# Patient Record
Sex: Male | Born: 1965 | Race: White | Hispanic: No | Marital: Married | Smoking: Never smoker
Health system: Southern US, Community
[De-identification: ages and names within clinical notes are randomized; demographics above are authoritative.]

## PROBLEM LIST (undated history)

## (undated) DIAGNOSIS — E039 Hypothyroidism, unspecified: Secondary | ICD-10-CM

## (undated) DIAGNOSIS — C61 Malignant neoplasm of prostate: Secondary | ICD-10-CM

## (undated) DIAGNOSIS — E785 Hyperlipidemia, unspecified: Secondary | ICD-10-CM

## (undated) DIAGNOSIS — I10 Essential (primary) hypertension: Secondary | ICD-10-CM

## (undated) DIAGNOSIS — E079 Disorder of thyroid, unspecified: Secondary | ICD-10-CM

## (undated) HISTORY — PX: COLONOSCOPY: SHX174

## (undated) HISTORY — PX: TONSILLECTOMY: SUR1361

## (undated) HISTORY — DX: Hyperlipidemia, unspecified: E78.5

---

## 2002-01-23 ENCOUNTER — Encounter: Admission: RE | Admit: 2002-01-23 | Discharge: 2002-01-23 | Payer: Self-pay | Admitting: Internal Medicine

## 2002-01-23 ENCOUNTER — Encounter: Payer: Self-pay | Admitting: Internal Medicine

## 2007-12-12 ENCOUNTER — Encounter: Admission: RE | Admit: 2007-12-12 | Discharge: 2007-12-12 | Payer: Self-pay | Admitting: Internal Medicine

## 2013-11-04 DIAGNOSIS — C61 Malignant neoplasm of prostate: Secondary | ICD-10-CM

## 2013-11-04 HISTORY — PX: PROSTATE BIOPSY: SHX241

## 2013-11-04 HISTORY — DX: Malignant neoplasm of prostate: C61

## 2013-12-16 ENCOUNTER — Ambulatory Visit (HOSPITAL_COMMUNITY)
Admission: RE | Admit: 2013-12-16 | Discharge: 2013-12-16 | Disposition: A | Discharge status: Home / Self Care | Payer: Managed Care, Other (non HMO) | Source: Ambulatory Visit | Attending: Urology | Admitting: Urology

## 2013-12-16 ENCOUNTER — Other Ambulatory Visit (HOSPITAL_COMMUNITY): Payer: Self-pay | Admitting: Urology

## 2013-12-16 DIAGNOSIS — I10 Essential (primary) hypertension: Secondary | ICD-10-CM | POA: Insufficient documentation

## 2013-12-16 DIAGNOSIS — C61 Malignant neoplasm of prostate: Secondary | ICD-10-CM | POA: Insufficient documentation

## 2013-12-23 ENCOUNTER — Encounter: Payer: Self-pay | Admitting: Radiation Oncology

## 2013-12-23 DIAGNOSIS — C61 Malignant neoplasm of prostate: Secondary | ICD-10-CM | POA: Insufficient documentation

## 2013-12-23 NOTE — Progress Notes (Signed)
GU Location of Tumor / Histology: prostate  If Prostate Cancer, Gleason Score is (3 + 3) and PSA is (5.19 on 07/21/13)  Patient presented 5 months ago with signs/symptoms of: elevated PSA, chronic nocturia x 1  Biopsies of prostate (if applicable) revealed: 05/07/75  adenocarcinoma, 2/12 cores, Gleason 3+3=6, volume 35 cc  Past/Anticipated interventions by urology, if any: biopsy  Past/Anticipated interventions by medical oncology, if any: none  Weight changes, if any: no  Bowel/Bladder complaints, if any:  Nocturia x 1,  IPSS 1  Nausea/Vomiting, if any: no  Pain issues, if any:  no  SAFETY ISSUES:  Prior radiation? no  Pacemaker/ICD? no  Possible current pregnancy? na  Is the patient on methotrexate? no  Current Complaints / other details:  Married, 1 son, 1 daughter, Engineer, building services.  Leaning towards surgery, but would like 2nd opinion. Referral placed to [facility] urology.  Father had prostate cancer age 54, treated successfully with radiation, lived to be 37.

## 2013-12-24 ENCOUNTER — Ambulatory Visit
Admission: RE | Admit: 2013-12-24 | Discharge: 2013-12-24 | Disposition: A | Discharge status: Home / Self Care | Payer: Managed Care, Other (non HMO) | Source: Ambulatory Visit | Attending: Radiation Oncology | Admitting: Radiation Oncology

## 2013-12-24 ENCOUNTER — Encounter: Payer: Self-pay | Admitting: Radiation Oncology

## 2013-12-24 VITALS — BP 154/93 | HR 82 | Temp 98.5°F | Resp 20 | Ht 74.0 in | Wt 255.6 lb

## 2013-12-24 DIAGNOSIS — C61 Malignant neoplasm of prostate: Secondary | ICD-10-CM | POA: Insufficient documentation

## 2013-12-24 DIAGNOSIS — Z923 Personal history of irradiation: Secondary | ICD-10-CM | POA: Insufficient documentation

## 2013-12-24 DIAGNOSIS — Z79899 Other long term (current) drug therapy: Secondary | ICD-10-CM | POA: Insufficient documentation

## 2013-12-24 HISTORY — DX: Malignant neoplasm of prostate: C61

## 2013-12-24 HISTORY — DX: Essential (primary) hypertension: I10

## 2013-12-24 HISTORY — DX: Disorder of thyroid, unspecified: E07.9

## 2013-12-24 NOTE — Progress Notes (Signed)
[facility] NEW PATIENT EVALUATION  Name: Adam Crane MRN: [id number]  Date:   12/24/2013           DOB: [birth year]  Status: outpatient   CC:   Molli Hazard,* Dr. Wenda Low   REFERRING PHYSICIAN: Molli Hazard,*   DIAGNOSIS: Stage TI C. favorable risk adenocarcinoma prostate.   HISTORY OF PRESENT ILLNESS:  Adam Crane is a 48 y.o. male who is seen today through the courtesy of Dr. Jasmine December for evaluation of his stage TI C. favorable risk adenocarcinoma prostate. He was found by his primary care physician to have a PSA of 5.49 on 07/06/2013. The patient is not aware of any previous PSA determinations. The patient was seen in consultation by Dr. Jasmine December who repeated his PSA on 07/21/2013 and this was 5.19. He underwent ultrasound-guided biopsies of Dr. Jasmine December on 11/04/2013. He was found to have Gleason 6 (3+3) involving 5% of one core from the right lateral mid gland, 10% of one core from right lateral apex, and 5 of the biopsy showing atypia, PIN or high-grade PIN. His gland volume was 35 cc. His pathology was reviewed by Dr. Tommie Ard at [facility]. He concurred. He is doing well from a GU and GI standpoint. His I PSS score is 1. He is potent.  PREVIOUS RADIATION THERAPY: No   PAST MEDICAL HISTORY:  has a past medical history of Prostate cancer (11/04/13); Thyroid disease; and Hypertension.     PAST SURGICAL HISTORY:  Past Surgical History  Procedure Laterality Date  . Tonsillectomy    . Prostate biopsy  11/04/13    gleason 6, vol 35 cc     FAMILY HISTORY: family history includes Cancer in his father; Cancer (age of onset: 33) in his paternal grandfather; Macular degeneration in his mother. As follows diagnosed with prostate cancer in his 19s and underwent radiation therapy.    SOCIAL HISTORY:  reports that he has never smoked. He does not have any smokeless tobacco history on file. He reports that he drinks  alcohol. He reports that he does not use illicit drugs. Married, 2 children. He manages an Administrator.   ALLERGIES: Review of patient's allergies indicates no known allergies.   MEDICATIONS:  Current Outpatient Prescriptions  Medication Sig Dispense Refill  . amLODipine (NORVASC) 5 MG tablet Take 5 mg by mouth daily.      Marland Kitchen aspirin 81 MG tablet Take 81 mg by mouth daily.      . Flaxseed, Linseed, (FLAX SEED OIL PO) Take by mouth.      . levothyroxine (SYNTHROID, LEVOTHROID) 200 MCG tablet Take 200 mcg by mouth daily before breakfast.      . levothyroxine (SYNTHROID, LEVOTHROID) 25 MCG tablet Take 25 mcg by mouth daily before breakfast.      . losartan (COZAAR) 100 MG tablet Take 100 mg by mouth daily.      . milk thistle 175 MG tablet Take 175 mg by mouth daily.      . Multiple Vitamin (MULTIVITAMIN) tablet Take 1 tablet by mouth daily.       No current facility-administered medications for this encounter.     REVIEW OF SYSTEMS:  Pertinent items are noted in HPI.    PHYSICAL EXAM:  height is 6\' 2"  (1.88 m) and weight is 255 lb 9.6 oz (115.939 kg). His oral temperature is 98.5 F (36.9 C). His blood pressure is 154/93 and his pulse is 82. His respiration is  20.   He's not examined today.   LABORATORY DATA:  No results found for this basename: WBC, HGB, HCT, MCV, PLT   No results found for this basename: NA, K, CL, CO2   No results found for this basename: ALT, AST, GGT, ALKPHOS, BILITOT   PSA 5.19   IMPRESSION: Stage TI C. favorable risk adenocarcinoma prostate. I explained to the patient and his wife that his prognosis is related to his stage, Gleason score, and PSA level. All are favorable. Other prognostic factors include disease volume and PSA doubling time, and these also appear to be favorable although we do not have a previous PSA. Management options include surgery versus close surveillance versus radiation therapy. In view of his young age I do not  recommend surveillance. Radiation therapy options include seed implantation alone or 8 weeks of external beam/IMRT. We discussed the potential acute and late toxicities of radiation therapy. In view of his age, I recommend surgery. He plans on getting a second opinion at [facility]. Lastly, we discussed having his son consider prostate cancer screening at age 52 based on his family history.   PLAN: He will get back with Dr. Jasmine December following his second opinion at [facility].   I spent 60 minutes minutes face to face with the patient and more than 50% of that time was spent in counseling and/or coordination of care.

## 2013-12-24 NOTE — Progress Notes (Signed)
Please see the Nurse Progress Note in the MD Initial Consult Encounter for this patient. 

## 2014-07-26 ENCOUNTER — Other Ambulatory Visit: Payer: Self-pay | Admitting: Urology

## 2014-08-31 NOTE — Patient Instructions (Addendum)
Adam Crane  08/31/2014   Your procedure is scheduled on: 09/06/2014     Report to [facility].  Follow the Signs to [place] at  [place]      am  Call this number if you have problems the morning of surgery: [phone]   Remember:   Do not eat food or drink liquids after midnight.   Take these medicines the morning of surgery with A SIP OF WATER:  Amlodipine ( NOrvasc ) , Synthroid    Do not wear jewelry,   Do not wear lotions, powders, or perfumes.  deodorant.  . Men may shave face and neck.  Do not bring valuables to the hospital.  Contacts, dentures or bridgework may not be worn into surgery.  Leave suitcase in the car. After surgery it may be brought to your room.  For patients admitted to the hospital, checkout time is 11:00 AM the day of  discharge.        Please read over the following fact sheets that you were given: , coughing and deep breathing exercises, leg exercises            [place] - Preparing for Surgery Before surgery, you can play an important role.  Because skin is not sterile, your skin needs to be as free of germs as possible.  You can reduce the number of germs on your skin by washing with CHG (chlorahexidine gluconate) soap before surgery.  CHG is an antiseptic cleaner which kills germs and bonds with the skin to continue killing germs even after washing. Please DO NOT use if you have an allergy to CHG or antibacterial soaps.  If your skin becomes reddened/irritated stop using the CHG and inform your nurse when you arrive at Short Stay. Do not shave (including legs and underarms) for at least 48 hours prior to the first CHG shower.  You may shave your face/neck. Please follow these instructions carefully:  1.  Shower with CHG Soap the night before surgery and the  morning of Surgery.  2.  If you choose to wash your hair, wash your hair first as usual with your  normal  shampoo.  3.  After you shampoo, rinse your hair and body  thoroughly to remove the  shampoo.                           4.  Use CHG as you would any other liquid soap.  You can apply chg directly  to the skin and wash                       Gently with a scrungie or clean washcloth.  5.  Apply the CHG Soap to your body ONLY FROM THE NECK DOWN.   Do not use on face/ open                           Wound or open sores. Avoid contact with eyes, ears mouth and genitals (private parts).                       Wash face,  Genitals (private parts) with your normal soap.             6.  Wash thoroughly, paying special attention to the area where your surgery  will be performed.  7.  Thoroughly  rinse your body with warm water from the neck down.  8.  DO NOT shower/wash with your normal soap after using and rinsing off  the CHG Soap.                9.  Pat yourself dry with a clean towel.            10.  Wear clean pajamas.            11.  Place clean sheets on your bed the night of your first shower and do not  sleep with pets. Day of Surgery : Do not apply any lotions/deodorants the morning of surgery.  Please wear clean clothes to the hospital/surgery center.  FAILURE TO FOLLOW THESE INSTRUCTIONS MAY RESULT IN THE CANCELLATION OF YOUR SURGERY PATIENT SIGNATURE_________________________________  NURSE SIGNATURE__________________________________  ________________________________________________________________________  WHAT IS A BLOOD TRANSFUSION? Blood Transfusion Information  A transfusion is the replacement of blood or some of its parts. Blood is made up of multiple cells which provide different functions.  Red blood cells carry oxygen and are used for blood loss replacement.  White blood cells fight against infection.  Platelets control bleeding.  Plasma helps clot blood.  Other blood products are available for specialized needs, such as hemophilia or other clotting disorders. BEFORE THE TRANSFUSION  Who gives blood for transfusions?   Healthy  volunteers who are fully evaluated to make sure their blood is safe. This is blood bank blood. Transfusion therapy is the safest it has ever been in the practice of medicine. Before blood is taken from a donor, a complete history is taken to make sure that person has no history of diseases nor engages in risky social behavior (examples are intravenous drug use or sexual activity with multiple partners). The donor's travel history is screened to minimize risk of transmitting infections, such as malaria. The donated blood is tested for signs of infectious diseases, such as HIV and hepatitis. The blood is then tested to be sure it is compatible with you in order to minimize the chance of a transfusion reaction. If you or a relative donates blood, this is often done in anticipation of surgery and is not appropriate for emergency situations. It takes many days to process the donated blood. RISKS AND COMPLICATIONS Although transfusion therapy is very safe and saves many lives, the main dangers of transfusion include:  1. Getting an infectious disease. 2. Developing a transfusion reaction. This is an allergic reaction to something in the blood you were given. Every precaution is taken to prevent this. The decision to have a blood transfusion has been considered carefully by your caregiver before blood is given. Blood is not given unless the benefits outweigh the risks. AFTER THE TRANSFUSION  Right after receiving a blood transfusion, you will usually feel much better and more energetic. This is especially true if your red blood cells have gotten low (anemic). The transfusion raises the level of the red blood cells which carry oxygen, and this usually causes an energy increase.  The nurse administering the transfusion will monitor you carefully for complications. HOME CARE INSTRUCTIONS  No special instructions are needed after a transfusion. You may find your energy is better. Speak with your caregiver about  any limitations on activity for underlying diseases you may have. SEEK MEDICAL CARE IF:   Your condition is not improving after your transfusion.  You develop redness or irritation at the intravenous (IV) site. SEEK IMMEDIATE MEDICAL CARE IF:  Any of the  following symptoms occur over the next 12 hours:  Shaking chills.  You have a temperature by mouth above 102 F (38.9 C), not controlled by medicine.  Chest, back, or muscle pain.  People around you feel you are not acting correctly or are confused.  Shortness of breath or difficulty breathing.  Dizziness and fainting.  You get a rash or develop hives.  You have a decrease in urine output.  Your urine turns a dark color or changes to pink, red, or brown. Any of the following symptoms occur over the next 10 days:  You have a temperature by mouth above 102 F (38.9 C), not controlled by medicine.  Shortness of breath.  Weakness after normal activity.  The white part of the eye turns yellow (jaundice).  You have a decrease in the amount of urine or are urinating less often.  Your urine turns a dark color or changes to pink, red, or brown. Document Released: 09/14/2000 Document Revised: 12/10/2011 Document Reviewed: 05/03/2008 ExitCare Patient Information 2014 [place].  _______________________________________________________________________  Incentive Spirometer  An incentive spirometer is a tool that can help keep your lungs clear and active. This tool measures how well you are filling your lungs with each breath. Taking long deep breaths may help reverse or decrease the chance of developing breathing (pulmonary) problems (especially infection) following:  A long period of time when you are unable to move or be active. BEFORE THE PROCEDURE   If the spirometer includes an indicator to show your best effort, your nurse or respiratory therapist will set it to a desired goal.  If possible, sit up straight or  lean slightly forward. Try not to slouch.  Hold the incentive spirometer in an upright position. INSTRUCTIONS FOR USE  3. Sit on the edge of your bed if possible, or sit up as far as you can in bed or on a chair. 4. Hold the incentive spirometer in an upright position. 5. Breathe out normally. 6. Place the mouthpiece in your mouth and seal your lips tightly around it. 7. Breathe in slowly and as deeply as possible, raising the piston or the ball toward the top of the column. 8. Hold your breath for 3-5 seconds or for as long as possible. Allow the piston or ball to fall to the bottom of the column. 9. Remove the mouthpiece from your mouth and breathe out normally. 10. Rest for a few seconds and repeat Steps 1 through 7 at least 10 times every 1-2 hours when you are awake. Take your time and take a few normal breaths between deep breaths. 11. The spirometer may include an indicator to show your best effort. Use the indicator as a goal to work toward during each repetition. 12. After each set of 10 deep breaths, practice coughing to be sure your lungs are clear. If you have an incision (the cut made at the time of surgery), support your incision when coughing by placing a pillow or rolled up towels firmly against it. Once you are able to get out of bed, walk around indoors and cough well. You may stop using the incentive spirometer when instructed by your caregiver.  RISKS AND COMPLICATIONS  Take your time so you do not get dizzy or light-headed.  If you are in pain, you may need to take or ask for pain medication before doing incentive spirometry. It is harder to take a deep breath if you are having pain. AFTER USE  Rest and breathe slowly and easily.  It can be helpful to keep track of a log of your progress. Your caregiver can provide you with a simple table to help with this. If you are using the spirometer at home, follow these instructions: [place] IF:   You are having  difficultly using the spirometer.  You have trouble using the spirometer as often as instructed.  Your pain medication is not giving enough relief while using the spirometer.  You develop fever of 100.5 F (38.1 C) or higher. SEEK IMMEDIATE MEDICAL CARE IF:   You cough up bloody sputum that had not been present before.  You develop fever of 102 F (38.9 C) or greater.  You develop worsening pain at or near the incision site. MAKE SURE YOU:   Understand these instructions.  Will watch your condition.  Will get help right away if you are not doing well or get worse. Document Released: 01/28/2007 Document Revised: 12/10/2011 Document Reviewed: 03/31/2007 [facility] Patient Information 2014 [place], [place].   ________________________________________________________________________

## 2014-09-01 ENCOUNTER — Ambulatory Visit (HOSPITAL_COMMUNITY)
Admission: RE | Admit: 2014-09-01 | Discharge: 2014-09-01 | Disposition: A | Discharge status: Home / Self Care | Payer: Managed Care, Other (non HMO) | Source: Ambulatory Visit | Attending: Urology | Admitting: Urology

## 2014-09-01 ENCOUNTER — Encounter (HOSPITAL_COMMUNITY)
Admission: RE | Admit: 2014-09-01 | Discharge: 2014-09-01 | Disposition: A | Discharge status: Home / Self Care | Payer: Managed Care, Other (non HMO) | Source: Ambulatory Visit | Attending: Urology | Admitting: Urology

## 2014-09-01 ENCOUNTER — Encounter (HOSPITAL_COMMUNITY): Payer: Self-pay

## 2014-09-01 DIAGNOSIS — Z8546 Personal history of malignant neoplasm of prostate: Secondary | ICD-10-CM | POA: Diagnosis not present

## 2014-09-01 DIAGNOSIS — Z01810 Encounter for preprocedural cardiovascular examination: Secondary | ICD-10-CM | POA: Diagnosis present

## 2014-09-01 DIAGNOSIS — I10 Essential (primary) hypertension: Secondary | ICD-10-CM | POA: Insufficient documentation

## 2014-09-01 DIAGNOSIS — Z01818 Encounter for other preprocedural examination: Secondary | ICD-10-CM

## 2014-09-01 HISTORY — DX: Hypothyroidism, unspecified: E03.9

## 2014-09-01 LAB — BASIC METABOLIC PANEL
Anion gap: 13 (ref 5–15)
BUN: 13 mg/dL (ref 6–23)
CHLORIDE: 99 meq/L (ref 96–112)
CO2: 26 mEq/L (ref 19–32)
Calcium: 9.9 mg/dL (ref 8.4–10.5)
Creatinine, Ser: 0.95 mg/dL (ref 0.50–1.35)
GFR calc Af Amer: >90 mL/min (ref >90)
GFR calc non Af Amer: >90 mL/min (ref >90)
Glucose, Bld: 93 mg/dL (ref 70–99)
POTASSIUM: 4.7 meq/L (ref 3.7–5.3)
Sodium: 138 mEq/L (ref 137–147)

## 2014-09-01 LAB — CBC
HCT: 45.5 % (ref 39.0–52.0)
Hemoglobin: 15.7 g/dL (ref 13.0–17.0)
MCH: 33.8 pg (ref 26.0–34.0)
MCHC: 34.5 g/dL (ref 30.0–36.0)
MCV: 97.8 fL (ref 78.0–100.0)
Platelets: 233 10*3/uL (ref 150–400)
RBC: 4.65 MIL/uL (ref 4.22–5.81)
RDW: 12 % (ref 11.5–15.5)
WBC: 6.8 10*3/uL (ref 4.0–10.5)

## 2014-09-04 NOTE — H&P (Signed)
Chief Complaint Prostate Cancer    History of Present Illness Adam Crane is a 48 year old gentleman who was noted to have an elevated PSA of 5.19 last fall. He underwent a prostate biopsy on 11/04/13 by Dr. Rolan Bucco that confirmed Gleason 3+3=6 adenocarcinoma of the prostate. His pathology was read by Dr. Tommie Ard at [facility] and agreed that there were 3 out of 12 biopsy cores positive for malignancy. He was counseled about treatment options by Dr. Jasmine December and Dr. Arloa Koh as well as Dr. Frances Furbish and Dr. Martinique Holmes at [facility]. He is very healthy overall with a history of hypertension and hypothyroidism. He does have a family history of prostate cancer with his father having been diagnosed around age 75 and treated with radiation therapy. His father subsequently lived to be 61 and died of unrelated causes.  Initial diagnosis: February 2015 TNM stage: cT1c Nx Mx PSA at diagnosis: 5.19 Gleason score: 3+3=6 Biopsy (11/04/13): 3/12 cores positive -- R apex (20%), R lateral apex (10%), R lateral mid (5%) -- multiple areas of atypia also noted Prostate volume: 35.1 cc PSAD: 0.15  Nomogram OC disease: 79% EPE: 15% SVI: 1% LNI: 1% PFS (surgery): 94% at 5 years, 89% at 10 years  Urinary function: He denies significant lower urinary tract symptoms. IPSS is 3. Erectile function: He denies erectile dysfunction. SHIM score is 24.   Past Medical History Problems  1. History of hypertension (V12.59) 2. History of hypothyroidism (V12.29)  Surgical History Problems  1. History of Tonsillectomy  Current Meds 1. AmLODIPine Besylate 10 MG Oral Tablet;  Therapy: [id number] to Recorded 2. Aspirin 81 MG Oral Tablet;  Therapy: (Recorded:21Oct2014) to Recorded 3. Levothyroxine Sodium 200 MCG Oral Tablet;  Therapy: [id number] to Recorded 4. Levothyroxine Sodium 25 MCG Oral Tablet;  Therapy: [id number] to Recorded 5. Losartan Potassium 100 MG Oral Tablet;  Therapy: [phone] to  Recorded  Allergies Medication  1. No Known Drug Allergies  Family History Problems  1. Family history of Death In The Family Father : Father   at age 57; old age 93. Family history of Family Health Status Number Of Children   1 son and 1 daughter 3. Family history of Prostate Cancer (T51.76) : Father  Social History Problems    Alcohol Use   4 per day   Marital History - Currently Married   Never A Smoker   Occupation:   Engineer, building services  Review of Systems Constitutional, skin, eye, otolaryngeal, hematologic/lymphatic, cardiovascular, pulmonary, endocrine, musculoskeletal, gastrointestinal, neurological and psychiatric system(s) were reviewed and pertinent findings if present are noted.    Physical Exam Constitutional: Well nourished and well developed . No acute distress.  ENT:. The ears and nose are normal in appearance.  Neck: The appearance of the neck is normal and no neck mass is present.  Pulmonary: No respiratory distress, normal respiratory rhythm and effort and clear bilateral breath sounds.  Cardiovascular: Heart rate and rhythm are normal . No peripheral edema.  Abdomen: The abdomen is soft and nontender. No masses are palpated. No CVA tenderness. No hernias are palpable. No hepatosplenomegaly noted.  Lymphatics: The femoral and inguinal nodes are not enlarged or tender.  Skin: Normal skin turgor, no visible rash and no visible skin lesions.  Neuro/Psych:. Mood and affect are appropriate.    Assessment Assessed  1. Prostate cancer (185)   Discussion/Summary 1. Prostate cancer: He has elected to proceed with surgical therapy and will plan to undergo a robotic assisted laparoscopic radical prostatectomy.

## 2014-09-05 NOTE — Anesthesia Preprocedure Evaluation (Addendum)
Anesthesia Evaluation  Patient identified by MRN, date of birth, ID band Patient awake    Reviewed: Allergy & Precautions, H&P , NPO status , Patient's Chart, lab work & pertinent test results  History of Anesthesia Complications Negative for: history of anesthetic complications  Airway Mallampati: II  TM Distance: >3 FB Neck ROM: Full    Dental no notable dental hx. (+) Dental Advisory Given   Pulmonary neg pulmonary ROS,  breath sounds clear to auscultation  Pulmonary exam normal       Cardiovascular Exercise Tolerance: Good hypertension, Pt. on medications Rhythm:Regular Rate:Normal     Neuro/Psych negative neurological ROS  negative psych ROS   GI/Hepatic negative GI ROS, Neg liver ROS,   Endo/Other  Hypothyroidism obesity  Renal/GU negative Renal ROS  negative genitourinary   Musculoskeletal negative musculoskeletal ROS (+)   Abdominal (+) + obese,   Peds negative pediatric ROS (+)  Hematology negative hematology ROS (+)   Anesthesia Other Findings   Reproductive/Obstetrics negative OB ROS                            Anesthesia Physical Anesthesia Plan  ASA: II  Anesthesia Plan: General   Post-op Pain Management:    Induction: Intravenous  Airway Management Planned: Oral ETT  Additional Equipment:   Intra-op Plan:   Post-operative Plan: Extubation in OR  Informed Consent: I have reviewed the patients History and Physical, chart, labs and discussed the procedure including the risks, benefits and alternatives for the proposed anesthesia with the patient or authorized representative who has indicated his/her understanding and acceptance.   Dental advisory given  Plan Discussed with: CRNA  Anesthesia Plan Comments:         Anesthesia Quick Evaluation

## 2014-09-06 ENCOUNTER — Inpatient Hospital Stay (HOSPITAL_COMMUNITY): Payer: Managed Care, Other (non HMO) | Admitting: Anesthesiology

## 2014-09-06 ENCOUNTER — Inpatient Hospital Stay (HOSPITAL_COMMUNITY)
Admission: RE | Admit: 2014-09-06 | Discharge: 2014-09-07 | DRG: 708 | Disposition: A | Discharge status: Home / Self Care | Payer: Managed Care, Other (non HMO) | Source: Ambulatory Visit | Attending: Urology | Admitting: Urology

## 2014-09-06 ENCOUNTER — Encounter (HOSPITAL_COMMUNITY)
Admission: RE | Disposition: A | Discharge status: Home / Self Care | Payer: Self-pay | Source: Ambulatory Visit | Attending: Urology

## 2014-09-06 ENCOUNTER — Encounter (HOSPITAL_COMMUNITY): Payer: Self-pay | Admitting: *Deleted

## 2014-09-06 DIAGNOSIS — I10 Essential (primary) hypertension: Secondary | ICD-10-CM | POA: Diagnosis present

## 2014-09-06 DIAGNOSIS — E039 Hypothyroidism, unspecified: Secondary | ICD-10-CM | POA: Diagnosis present

## 2014-09-06 DIAGNOSIS — C61 Malignant neoplasm of prostate: Secondary | ICD-10-CM | POA: Diagnosis present

## 2014-09-06 HISTORY — PX: ROBOT ASSISTED LAPAROSCOPIC RADICAL PROSTATECTOMY: SHX5141

## 2014-09-06 LAB — TYPE AND SCREEN
ABO/RH(D): AB POS
Antibody Screen: NEGATIVE

## 2014-09-06 LAB — HEMOGLOBIN AND HEMATOCRIT, BLOOD
HCT: 42.8 % (ref 39.0–52.0)
Hemoglobin: 14.9 g/dL (ref 13.0–17.0)

## 2014-09-06 LAB — ABO/RH: ABO/RH(D): AB POS

## 2014-09-06 SURGERY — ROBOTIC ASSISTED LAPAROSCOPIC RADICAL PROSTATECTOMY LEVEL 1
Anesthesia: General

## 2014-09-06 MED ORDER — HYDROCODONE-ACETAMINOPHEN 5-325 MG PO TABS
1.0000 | ORAL_TABLET | Freq: Four times a day (QID) | ORAL | Status: DC | PRN
Start: 2014-09-06 — End: 2021-09-27

## 2014-09-06 MED ORDER — SUFENTANIL CITRATE 50 MCG/ML IV SOLN
INTRAVENOUS | Status: AC
  Filled 2014-09-06: qty 1

## 2014-09-06 MED ORDER — SODIUM CHLORIDE 0.9 % IR SOLN
Status: DC | PRN
  Administered 2014-09-06: 1 via INTRAVESICAL

## 2014-09-06 MED ORDER — PROPOFOL 10 MG/ML IV BOLUS
INTRAVENOUS | Status: AC
  Filled 2014-09-06: qty 20

## 2014-09-06 MED ORDER — HYDROMORPHONE HCL 1 MG/ML IJ SOLN: 0.2500 mg | INTRAMUSCULAR | Status: DC | PRN

## 2014-09-06 MED ORDER — EPHEDRINE SULFATE 50 MG/ML IJ SOLN
INTRAMUSCULAR | Status: AC
  Filled 2014-09-06: qty 1

## 2014-09-06 MED ORDER — MIDAZOLAM HCL 2 MG/2ML IJ SOLN
INTRAMUSCULAR | Status: AC
  Filled 2014-09-06: qty 2

## 2014-09-06 MED ORDER — CEFAZOLIN SODIUM 1-5 GM-% IV SOLN
1.0000 g | Freq: Three times a day (TID) | INTRAVENOUS | Status: AC
  Administered 2014-09-06 (×2): 1 g via INTRAVENOUS
  Filled 2014-09-06 (×2): qty 50

## 2014-09-06 MED ORDER — NEOSTIGMINE METHYLSULFATE 10 MG/10ML IV SOLN
INTRAVENOUS | Status: AC
  Filled 2014-09-06: qty 1

## 2014-09-06 MED ORDER — BUPIVACAINE-EPINEPHRINE 0.25% -1:200000 IJ SOLN
INTRAMUSCULAR | Status: AC
  Filled 2014-09-06: qty 1

## 2014-09-06 MED ORDER — KCL IN DEXTROSE-NACL 20-5-0.45 MEQ/L-%-% IV SOLN
INTRAVENOUS | Status: AC
  Filled 2014-09-06: qty 1000

## 2014-09-06 MED ORDER — GLYCOPYRROLATE 0.2 MG/ML IJ SOLN
INTRAMUSCULAR | Status: AC
  Filled 2014-09-06: qty 3

## 2014-09-06 MED ORDER — HYDROMORPHONE HCL 2 MG/ML IJ SOLN
INTRAMUSCULAR | Status: AC
  Filled 2014-09-06: qty 1

## 2014-09-06 MED ORDER — HYDROMORPHONE HCL 1 MG/ML IJ SOLN
INTRAMUSCULAR | Status: DC | PRN
  Administered 2014-09-06: 1 mg via INTRAVENOUS
  Administered 2014-09-06 (×4): 0.5 mg via INTRAVENOUS

## 2014-09-06 MED ORDER — ONDANSETRON HCL 4 MG/2ML IJ SOLN
INTRAMUSCULAR | Status: AC
  Filled 2014-09-06: qty 2

## 2014-09-06 MED ORDER — MIDAZOLAM HCL 5 MG/5ML IJ SOLN
INTRAMUSCULAR | Status: DC | PRN
  Administered 2014-09-06: 2 mg via INTRAVENOUS

## 2014-09-06 MED ORDER — LEVOTHYROXINE SODIUM 200 MCG PO TABS
200.0000 ug | ORAL_TABLET | Freq: Every day | ORAL | Status: DC
  Administered 2014-09-07: 200 ug via ORAL
  Filled 2014-09-06 (×2): qty 1

## 2014-09-06 MED ORDER — ONDANSETRON HCL 4 MG/2ML IJ SOLN
INTRAMUSCULAR | Status: DC | PRN
Start: 2014-09-06 — End: 2014-09-06
  Administered 2014-09-06: 4 mg via INTRAVENOUS

## 2014-09-06 MED ORDER — LEVOTHYROXINE SODIUM 25 MCG PO TABS
25.0000 ug | ORAL_TABLET | Freq: Every day | ORAL | Status: DC
  Administered 2014-09-07: 25 ug via ORAL
  Filled 2014-09-06 (×2): qty 1

## 2014-09-06 MED ORDER — ROCURONIUM BROMIDE 100 MG/10ML IV SOLN
INTRAVENOUS | Status: DC | PRN
  Administered 2014-09-06: 50 mg via INTRAVENOUS
  Administered 2014-09-06 (×2): 15 mg via INTRAVENOUS
  Administered 2014-09-06 (×2): 20 mg via INTRAVENOUS
  Administered 2014-09-06: 15 mg via INTRAVENOUS

## 2014-09-06 MED ORDER — LIDOCAINE HCL (CARDIAC) 20 MG/ML IV SOLN
INTRAVENOUS | Status: AC
  Filled 2014-09-06: qty 5

## 2014-09-06 MED ORDER — CIPROFLOXACIN HCL 500 MG PO TABS: 500.0000 mg | ORAL_TABLET | Freq: Two times a day (BID) | ORAL | Status: DC

## 2014-09-06 MED ORDER — CEFAZOLIN SODIUM-DEXTROSE 2-3 GM-% IV SOLR
2.0000 g | INTRAVENOUS | Status: AC
  Administered 2014-09-06: 2 g via INTRAVENOUS

## 2014-09-06 MED ORDER — ROCURONIUM BROMIDE 100 MG/10ML IV SOLN
INTRAVENOUS | Status: AC
  Filled 2014-09-06: qty 1

## 2014-09-06 MED ORDER — LIDOCAINE HCL (CARDIAC) 20 MG/ML IV SOLN
INTRAVENOUS | Status: DC | PRN
Start: 2014-09-06 — End: 2014-09-06
  Administered 2014-09-06: 60 mg via INTRAVENOUS

## 2014-09-06 MED ORDER — ONDANSETRON HCL 4 MG/2ML IJ SOLN: 4.0000 mg | Freq: Once | INTRAMUSCULAR | Status: DC | PRN

## 2014-09-06 MED ORDER — HEPARIN SODIUM (PORCINE) 1000 UNIT/ML IJ SOLN
INTRAMUSCULAR | Status: AC
  Filled 2014-09-06: qty 1

## 2014-09-06 MED ORDER — KCL IN DEXTROSE-NACL 20-5-0.45 MEQ/L-%-% IV SOLN
INTRAVENOUS | Status: DC
  Administered 2014-09-06 – 2014-09-07 (×3): via INTRAVENOUS
  Filled 2014-09-06 (×5): qty 1000

## 2014-09-06 MED ORDER — SODIUM CHLORIDE 0.9 % IV SOLN
INTRAVENOUS | Status: DC | PRN
  Administered 2014-09-06: 11:00:00 via INTRAVENOUS

## 2014-09-06 MED ORDER — DOCUSATE SODIUM 100 MG PO CAPS
100.0000 mg | ORAL_CAPSULE | Freq: Two times a day (BID) | ORAL | Status: DC
  Administered 2014-09-06 – 2014-09-07 (×3): 100 mg via ORAL
  Filled 2014-09-06 (×4): qty 1

## 2014-09-06 MED ORDER — DIPHENHYDRAMINE HCL 50 MG/ML IJ SOLN: 12.5000 mg | Freq: Four times a day (QID) | INTRAMUSCULAR | Status: DC | PRN

## 2014-09-06 MED ORDER — LACTATED RINGERS IV SOLN
INTRAVENOUS | Status: DC | PRN
  Administered 2014-09-06 (×2): via INTRAVENOUS

## 2014-09-06 MED ORDER — BUPIVACAINE-EPINEPHRINE 0.25% -1:200000 IJ SOLN
INTRAMUSCULAR | Status: DC | PRN
  Administered 2014-09-06: 30 mL

## 2014-09-06 MED ORDER — METOCLOPRAMIDE HCL 5 MG/ML IJ SOLN
INTRAMUSCULAR | Status: DC | PRN
  Administered 2014-09-06: 10 mg via INTRAVENOUS

## 2014-09-06 MED ORDER — DIPHENHYDRAMINE HCL 12.5 MG/5ML PO ELIX: 12.5000 mg | ORAL_SOLUTION | Freq: Four times a day (QID) | ORAL | Status: DC | PRN

## 2014-09-06 MED ORDER — GLYCOPYRROLATE 0.2 MG/ML IJ SOLN
INTRAMUSCULAR | Status: DC | PRN
  Administered 2014-09-06: 0.6 mg via INTRAVENOUS

## 2014-09-06 MED ORDER — SODIUM CHLORIDE 0.9 % IV BOLUS (SEPSIS)
1000.0000 mL | Freq: Once | INTRAVENOUS | Status: AC
  Administered 2014-09-06: 1000 mL via INTRAVENOUS

## 2014-09-06 MED ORDER — MORPHINE SULFATE 2 MG/ML IJ SOLN: 2.0000 mg | INTRAMUSCULAR | Status: DC | PRN

## 2014-09-06 MED ORDER — SUFENTANIL CITRATE 50 MCG/ML IV SOLN
INTRAVENOUS | Status: DC | PRN
  Administered 2014-09-06: 10 ug via INTRAVENOUS
  Administered 2014-09-06: 5 ug via INTRAVENOUS
  Administered 2014-09-06: 10 ug via INTRAVENOUS
  Administered 2014-09-06: 5 ug via INTRAVENOUS
  Administered 2014-09-06: 10 ug via INTRAVENOUS
  Administered 2014-09-06 (×2): 5 ug via INTRAVENOUS

## 2014-09-06 MED ORDER — KETOROLAC TROMETHAMINE 15 MG/ML IJ SOLN
15.0000 mg | Freq: Four times a day (QID) | INTRAMUSCULAR | Status: DC
  Administered 2014-09-06 – 2014-09-07 (×4): 15 mg via INTRAVENOUS
  Filled 2014-09-06 (×6): qty 1

## 2014-09-06 MED ORDER — LACTATED RINGERS IV SOLN
INTRAVENOUS | Status: DC | PRN
  Administered 2014-09-06: 10000 mL

## 2014-09-06 MED ORDER — SODIUM CHLORIDE 0.9 % IJ SOLN
INTRAMUSCULAR | Status: AC
  Filled 2014-09-06: qty 20

## 2014-09-06 MED ORDER — NEOSTIGMINE METHYLSULFATE 10 MG/10ML IV SOLN
INTRAVENOUS | Status: DC | PRN
  Administered 2014-09-06: 4 mg via INTRAVENOUS

## 2014-09-06 MED ORDER — PROPOFOL 10 MG/ML IV BOLUS
INTRAVENOUS | Status: DC | PRN
  Administered 2014-09-06: 200 mg via INTRAVENOUS

## 2014-09-06 MED ORDER — CEFAZOLIN SODIUM-DEXTROSE 2-3 GM-% IV SOLR
INTRAVENOUS | Status: AC
  Filled 2014-09-06: qty 50

## 2014-09-06 MED ORDER — ACETAMINOPHEN 325 MG PO TABS: 650.0000 mg | ORAL_TABLET | ORAL | Status: DC | PRN

## 2014-09-06 SURGICAL SUPPLY — 48 items
CABLE HIGH FREQUENCY MONO STRZ (ELECTRODE) ×3 IMPLANT
CANISTER SUCT 3000ML (MISCELLANEOUS) IMPLANT
CATH FOLEY 2WAY SLVR 18FR 30CC (CATHETERS) ×3 IMPLANT
CATH ROBINSON RED A/P 16FR (CATHETERS) ×3 IMPLANT
CATH ROBINSON RED A/P 8FR (CATHETERS) ×3 IMPLANT
CATH TIEMANN FOLEY 18FR 5CC (CATHETERS) ×3 IMPLANT
CHLORAPREP W/TINT 26ML (MISCELLANEOUS) ×3 IMPLANT
CLIP LIGATING HEM O LOK PURPLE (MISCELLANEOUS) ×6 IMPLANT
CLOTH BEACON ORANGE TIMEOUT ST (SAFETY) ×3 IMPLANT
COVER SURGICAL LIGHT HANDLE (MISCELLANEOUS) ×3 IMPLANT
COVER TIP SHEARS 8 DVNC (MISCELLANEOUS) ×1 IMPLANT
COVER TIP SHEARS 8MM DA VINCI (MISCELLANEOUS) ×2
CUTTER ECHEON FLEX ENDO 45 340 (ENDOMECHANICALS) ×3 IMPLANT
DECANTER SPIKE VIAL GLASS SM (MISCELLANEOUS) IMPLANT
DRAPE SURG IRRIG POUCH 19X23 (DRAPES) ×3 IMPLANT
DRSG TEGADERM 4X4.75 (GAUZE/BANDAGES/DRESSINGS) ×3 IMPLANT
DRSG TEGADERM 6X8 (GAUZE/BANDAGES/DRESSINGS) ×6 IMPLANT
ELECT REM PT RETURN 9FT ADLT (ELECTROSURGICAL) ×3
ELECTRODE REM PT RTRN 9FT ADLT (ELECTROSURGICAL) ×1 IMPLANT
GLOVE BIO SURGEON STRL SZ 6.5 (GLOVE) ×2 IMPLANT
GLOVE BIO SURGEONS STRL SZ 6.5 (GLOVE) ×1
GLOVE BIOGEL M STRL SZ7.5 (GLOVE) ×6 IMPLANT
GOWN STRL REUS W/TWL LRG LVL3 (GOWN DISPOSABLE) ×9 IMPLANT
HOLDER FOLEY CATH W/STRAP (MISCELLANEOUS) ×3 IMPLANT
IV LACTATED RINGERS 1000ML (IV SOLUTION) IMPLANT
KIT ACCESSORY DA VINCI DISP (KITS) ×2
KIT ACCESSORY DVNC DISP (KITS) ×1 IMPLANT
LIQUID BAND (GAUZE/BANDAGES/DRESSINGS) IMPLANT
NDL SAFETY ECLIPSE 18X1.5 (NEEDLE) ×1 IMPLANT
NEEDLE HYPO 18GX1.5 SHARP (NEEDLE) ×2
PACK ROBOT UROLOGY CUSTOM (CUSTOM PROCEDURE TRAY) ×3 IMPLANT
RELOAD GREEN ECHELON 45 (STAPLE) ×3 IMPLANT
SET TUBE IRRIG SUCTION NO TIP (IRRIGATION / IRRIGATOR) ×3 IMPLANT
SOLUTION ELECTROLUBE (MISCELLANEOUS) ×3 IMPLANT
SUT ETHILON 3 0 PS 1 (SUTURE) ×3 IMPLANT
SUT MNCRL 3 0 RB1 (SUTURE) IMPLANT
SUT MNCRL 3 0 VIOLET RB1 (SUTURE) IMPLANT
SUT MNCRL AB 4-0 PS2 18 (SUTURE) ×6 IMPLANT
SUT MONOCRYL 3 0 RB1 (SUTURE)
SUT VIC AB 0 CT1 27 (SUTURE) ×2
SUT VIC AB 0 CT1 27XBRD ANTBC (SUTURE) ×1 IMPLANT
SUT VIC AB 2-0 SH 27 (SUTURE) ×2
SUT VIC AB 2-0 SH 27X BRD (SUTURE) ×1 IMPLANT
SUT VICRYL 0 UR6 27IN ABS (SUTURE) ×6 IMPLANT
SYR 27GX1/2 1ML LL SAFETY (SYRINGE) ×3 IMPLANT
TOWEL OR 17X26 10 PK STRL BLUE (TOWEL DISPOSABLE) ×3 IMPLANT
TOWEL OR NON WOVEN STRL DISP B (DISPOSABLE) ×3 IMPLANT
WATER STERILE IRR 1500ML POUR (IV SOLUTION) IMPLANT

## 2014-09-06 NOTE — Op Note (Signed)
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 Robotic assisted laparoscopic radical prostatectomy (bilateral nerve sparing)  Surgeon: Roxy Horseman, Brooke Bonito. M.D.  Assistant: Debbrah Alar, PA-C  Anesthesia: General  Complications: None  EBL: 100 mL  IVF:  2000 mL crystalloid  Specimens: 1. Prostate and seminal vesicles  Disposition of specimens: Pathology  Drains: 1. 20 Fr coude catheter 2. # 19 Blake pelvic drain  Indication: Adam Crane is a 48 y.o. year old patient with clinically localized prostate cancer.  After a thorough review of the management options for treatment of prostate cancer, he elected to proceed with surgical therapy and the above procedure(s).  We have discussed the potential benefits and risks of the procedure, side effects of the proposed treatment, the likelihood of the patient achieving the goals of the procedure, and any potential problems that might occur during the procedure or recuperation. Informed consent has been obtained.  Description of procedure:  The patient was taken to the operating room and a general anesthetic was administered. He was given preoperative antibiotics, placed in the dorsal lithotomy position, and prepped and draped in the usual sterile fashion. Next a preoperative timeout was performed. A urethral catheter was placed into the bladder and a site was selected near the umbilicus for placement of the camera port. This was placed using a standard open Hassan technique which allowed entry into the peritoneal cavity under direct vision and without difficulty. A 12 mm port was placed and a pneumoperitoneum established. The camera was then used to inspect the abdomen and there was no evidence of any intra-abdominal injuries or other abnormalities. The remaining  abdominal ports were then placed. 8 mm robotic ports were placed in the right lower quadrant, left lower quadrant, and far left lateral abdominal wall. A 5 mm port was placed in the right upper quadrant and a 12 mm port was placed in the right lateral abdominal wall for laparoscopic assistance. All ports were placed under direct vision without difficulty. The surgical cart was then docked.   Utilizing the cautery scissors, the bladder was reflected posteriorly allowing entry into the space of Retzius and identification of the endopelvic fascia and prostate. The periprostatic fat was then removed from the prostate allowing full exposure of the endopelvic fascia. The endopelvic fascia was then incised from the apex back to the base of the prostate bilaterally and the underlying levator muscle fibers were swept laterally off the prostate thereby isolating the dorsal venous complex. The dorsal vein was then stapled and divided with a 45 mm Flex Echelon stapler. Attention then turned to the bladder neck which was divided anteriorly thereby allowing entry into the bladder and exposure of the urethral catheter. The catheter balloon was deflated and the catheter was brought into the operative field and used to retract the prostate anteriorly. The posterior bladder neck was then examined and was divided allowing further dissection between the bladder and prostate posteriorly until the vasa deferentia and seminal vessels were identified. The vasa deferentia were isolated, divided, and lifted anteriorly. The seminal vesicles were dissected down to their tips with care to control the seminal vascular arterial blood supply. These structures were then lifted anteriorly and the space between Denonvillier's fascia and the anterior rectum was developed with a combination of sharp and blunt dissection. This isolated the vascular pedicles of the prostate.  The lateral prostatic  fascia was then sharply incised allowing release of  the neurovascular bundles bilaterally. The vascular pedicles of the prostate were then ligated with Weck clips between the prostate and neurovascular bundles and divided with sharp cold scissor dissection resulting in neurovascular bundle preservation. The neurovascular bundles were then separated off the apex of the prostate and urethra bilaterally.  The urethra was then sharply transected allowing the prostate specimen to be disarticulated. The pelvis was copiously irrigated and hemostasis was ensured. There was no evidence for rectal injury.  Attention then turned to the urethral anastomosis. A 2-0 Vicryl slip knot was placed between Denonvillier's fascia, the posterior bladder neck, and the posterior urethra to reapproximate these structures. A double-armed 3-0 Monocryl suture was then used to perform a 360 running tension-free anastomosis between the bladder neck and urethra. A new urethral catheter was then placed into the bladder and irrigated. There were no blood clots within the bladder and the anastomosis appeared to be watertight. A #19 Blake drain was then brought through the left lateral 8 mm port site and positioned appropriately within the pelvis. It was secured to the skin with a nylon suture. The surgical cart was then undocked. The right lateral 12 mm port site was closed at the fascial level with a 0 Vicryl suture placed laparoscopically. All remaining ports were then removed under direct vision. The prostate specimen was removed intact within the Endopouch retrieval bag via the periumbilical camera port site. This fascial opening was closed with two running 0 Vicryl sutures. 0.25% Marcaine was then injected into all port sites and all incisions were reapproximated at the skin level with 4-0 Monocryl subcuticular sutures and Dermabond. The patient appeared to tolerate the procedure well and without complications. The patient was able to be extubated and transferred to the recovery unit in  satisfactory condition.  Pryor Curia MD

## 2014-09-06 NOTE — Plan of Care (Signed)
Problem: Phase I Progression Outcomes Goal: Pain controlled with appropriate interventions Outcome: Completed/Met Date Met:  09/06/14 Goal: NPO except ice chips or as ordered Outcome: Completed/Met Date Met:  09/06/14 Goal: Foley/JP patent Outcome: Completed/Met Date Met:  09/06/14 Goal: Incision/dressing intact Outcome: Completed/Met Date Met:  09/06/14 Goal: Walk in halls when awake from anesthesia Outcome: Completed/Met Date Met:  09/06/14 Goal: Initial discharge plan identified Outcome: Completed/Met Date Met:  09/06/14 Goal: Hemodynamically stable Outcome: Completed/Met Date Met:  09/06/14

## 2014-09-06 NOTE — Progress Notes (Signed)
Patient ID: Adam Crane, male   DOB: 1966-04-02, 48 y.o.   MRN: [id number] Post-op note  Subjective: The patient is doing well.  No complaints.  Has already ambulated   Objective: Vital signs in last 24 hours: Temp:  [97.7 F (36.5 C)-99.2 F (37.3 C)] 97.9 F (36.6 C) (12/07 1142) Pulse Rate:  [80-90] 83 (12/07 1142) Resp:  [10-18] 12 (12/07 1142) BP: (140-178)/(77-96) 140/77 mmHg (12/07 1142) SpO2:  [94 %-99 %] 95 % (12/07 1142) Weight:  [113.513 kg (250 lb 4 oz)] 113.513 kg (250 lb 4 oz) (12/07 0512)  Intake/Output from previous day:   Intake/Output this shift: Total I/O In: 3350 [P.O.:350; I.V.:3000] Out: 345 [Urine:200; Drains:45; Blood:100]  Physical Exam:  General: Alert and oriented. Abdomen: Soft, Nondistended. Incisions: Clean and dry. Urine: red  Lab Results:  Recent Labs  09/06/14 1115  HGB 14.9  HCT 42.8    Assessment/Plan: POD#0   1) Continue to monitor  2) DVT prophy, clears, IS, amb, pain control   LOS: 0 days   Jera Headings 09/06/2014, 3:32 PM

## 2014-09-06 NOTE — Anesthesia Postprocedure Evaluation (Signed)
  Anesthesia Post-op Note  Patient: Adam Crane  Procedure(s) Performed: Procedure(s) (LRB): ROBOTIC ASSISTED LAPAROSCOPIC RADICAL PROSTATECTOMY LEVEL 1 (N/A)  Patient Location: PACU  Anesthesia Type: General  Level of Consciousness: awake and alert   Airway and Oxygen Therapy: Patient Spontanous Breathing  Post-op Pain: mild  Post-op Assessment: Post-op Vital signs reviewed, Patient's Cardiovascular Status Stable, Respiratory Function Stable, Patent Airway and No signs of Nausea or vomiting  Last Vitals:  Filed Vitals:   09/06/14 1142  BP: 140/77  Pulse: 83  Temp: 36.6 C  Resp: 12    Post-op Vital Signs: stable   Complications: No apparent anesthesia complications

## 2014-09-06 NOTE — Discharge Instructions (Signed)

## 2014-09-06 NOTE — Transfer of Care (Signed)
Immediate Anesthesia Transfer of Care Note  Patient: Adam Crane  Procedure(s) Performed: Procedure(s): ROBOTIC ASSISTED LAPAROSCOPIC RADICAL PROSTATECTOMY LEVEL 1 (N/A)  Patient Location: PACU  Anesthesia Type:General  Level of Consciousness: awake, alert , oriented and patient cooperative  Airway & Oxygen Therapy: Patient Spontanous Breathing and Patient connected to face mask oxygen  Post-op Assessment: Report given to PACU RN, Post -op Vital signs reviewed and stable and Patient moving all extremities  Post vital signs: Reviewed and stable  Complications: No apparent anesthesia complications

## 2014-09-07 ENCOUNTER — Encounter (HOSPITAL_COMMUNITY): Payer: Self-pay | Admitting: Urology

## 2014-09-07 LAB — HEMOGLOBIN AND HEMATOCRIT, BLOOD
HCT: 33.3 % — ABNORMAL LOW (ref 39.0–52.0)
HCT: 34.4 % — ABNORMAL LOW (ref 39.0–52.0)
HEMOGLOBIN: 11.8 g/dL — AB (ref 13.0–17.0)
HEMOGLOBIN: 12.2 g/dL — AB (ref 13.0–17.0)

## 2014-09-07 MED ORDER — BISACODYL 10 MG RE SUPP
10.0000 mg | Freq: Once | RECTAL | Status: AC
  Administered 2014-09-07: 10 mg via RECTAL
  Filled 2014-09-07: qty 1

## 2014-09-07 MED ORDER — HYDROCODONE-ACETAMINOPHEN 5-325 MG PO TABS: 1.0000 | ORAL_TABLET | Freq: Four times a day (QID) | ORAL | Status: DC | PRN

## 2014-09-07 NOTE — Plan of Care (Signed)
Problem: Discharge Progression Outcomes Goal: Barriers To Progression Addressed/Resolved Outcome: Not Applicable Date Met:  52/08/02

## 2014-09-07 NOTE — Plan of Care (Signed)
Problem: Consults Goal: Radical Robotic Prostatectomy Patient Education Outcome: Completed/Met Date Met:  09/07/14     

## 2014-09-07 NOTE — Plan of Care (Signed)
Problem: Phase I Progression Outcomes Goal: Other Phase I Outcomes/Goals Outcome: Not Applicable Date Met:  31/49/70

## 2014-09-07 NOTE — Plan of Care (Signed)
Problem: Phase I Progression Outcomes Goal: Adequate I & O Outcome: Completed/Met Date Met:  09/07/14  Problem: Phase II Progression Outcomes Goal: Pain controlled Outcome: Completed/Met Date Met:  09/07/14 Goal: Ambulate in halls 4-6 x day Outcome: Completed/Met Date Met:  09/07/14 Goal: Tolerates clear liquids POD #1 Outcome: Completed/Met Date Met:  09/07/14 Goal: Vital signs stable Outcome: Completed/Met Date Met:  09/07/14     

## 2014-09-07 NOTE — Plan of Care (Signed)
Problem: Discharge Progression Outcomes Goal: Pain controlled with appropriate interventions Outcome: Completed/Met Date Met:  09/07/14     

## 2014-09-07 NOTE — Plan of Care (Signed)
Problem: Discharge Progression Outcomes Goal: Discharge plan in place and appropriate Outcome: Completed/Met Date Met:  09/07/14

## 2014-09-07 NOTE — Plan of Care (Signed)
Problem: Phase II Progression Outcomes Goal: Discharge plan established Outcome: Completed/Met Date Met:  09/07/14

## 2014-09-07 NOTE — Discharge Summary (Signed)
  Date of admission: 09/06/2014  Date of discharge: 09/07/2014  Admission diagnosis: Prostate Cancer  Discharge diagnosis: Prostate Cancer  History and Physical: For full details, please see admission history and physical. Briefly, Adam Crane is a 48 y.o. gentleman with localized prostate cancer.  After discussing management/treatment options, he elected to proceed with surgical treatment.  Hospital Course: Adam Crane was taken to the operating room on 09/06/2014 and underwent a robotic assisted laparoscopic radical prostatectomy. He tolerated this procedure well and without complications. Postoperatively, he was able to be transferred to a regular hospital room following recovery from anesthesia.  He was able to begin ambulating the night of surgery. He remained hemodynamically stable overnight.  He had excellent urine output with appropriately minimal output from his pelvic drain and his pelvic drain was removed on POD #1.  He was transitioned to oral pain medication, tolerated a clear liquid diet, and had met all discharge criteria and was able to be discharged home later on POD#1.  Laboratory values:  Recent Labs  09/06/14 1115 09/07/14 0353 09/07/14 1107  HGB 14.9 11.8* 12.2*  HCT 42.8 33.3* 34.4*    Disposition: Home  Discharge instruction: He was instructed to be ambulatory but to refrain from heavy lifting, strenuous activity, or driving. He was instructed on urethral catheter care.  Discharge medications:     Medication List    STOP taking these medications        aspirin 81 MG tablet     FLAX SEED OIL PO     ibuprofen 200 MG tablet  Commonly known as:  ADVIL,MOTRIN     milk thistle 175 MG tablet     multivitamin tablet      TAKE these medications        amLODipine 5 MG tablet  Commonly known as:  NORVASC  Take 5 mg by mouth every morning.     ciprofloxacin 500 MG tablet  Commonly known as:  CIPRO  Take 1 tablet (500 mg total) by mouth 2 (two)  times daily. Start day prior to office visit for foley removal     HYDROcodone-acetaminophen 5-325 MG per tablet  Commonly known as:  NORCO  Take 1-2 tablets by mouth every 6 (six) hours as needed.     levothyroxine 200 MCG tablet  Commonly known as:  SYNTHROID, LEVOTHROID  Take 200 mcg by mouth daily before breakfast. Takes with the 1mg to = 2237m every morning.     levothyroxine 25 MCG tablet  Commonly known as:  SYNTHROID, LEVOTHROID  Take 25 mcg by mouth daily before breakfast. Takes with the 200 mcg to = 22538mevery morning.     losartan 100 MG tablet  Commonly known as:  COZAAR  Take 100 mg by mouth every morning.        Followup: He will followup in 1 week for catheter removal and to discuss his surgical pathology results.

## 2014-09-07 NOTE — Plan of Care (Signed)
Problem: Phase II Progression Outcomes Goal: Other Phase II Outcomes/Goals Outcome: Not Applicable Date Met:  09/07/14     

## 2014-09-07 NOTE — Plan of Care (Signed)
Problem: Discharge Progression Outcomes Goal: Complications resolved/controlled Outcome: Not Applicable Date Met:  71/99/41

## 2014-09-07 NOTE — Plan of Care (Signed)
Problem: Discharge Progression Outcomes Goal: Walks without assist or return to baseline Outcome: Completed/Met Date Met:  09/07/14

## 2014-09-07 NOTE — Plan of Care (Signed)
Problem: Discharge Progression Outcomes Goal: Tolerating diet Outcome: Completed/Met Date Met:  09/07/14

## 2014-09-07 NOTE — Plan of Care (Signed)
Problem: Discharge Progression Outcomes Goal: Hemodynamically stable Outcome: Completed/Met Date Met:  09/07/14

## 2014-09-07 NOTE — Progress Notes (Addendum)
Patient ID: Adam Crane, male   DOB: [birth year], 48 y.o.   MRN: [id number]  1 Day Post-Op Subjective: The patient is doing well.  No nausea or vomiting. Pain is adequately controlled.  Objective: Vital signs in last 24 hours: Temp:  [97.7 F (36.5 C)-98.5 F (36.9 C)] 98.5 F (36.9 C) (12/08 0547) Pulse Rate:  [67-95] 67 (12/08 0547) Resp:  [10-18] 18 (12/08 0547) BP: (135-178)/(77-96) 143/85 mmHg (12/08 0547) SpO2:  [91 %-99 %] 92 % (12/08 0547)  Intake/Output from previous day: 12/07 0701 - 12/08 0700 In: 6370 [P.O.:710; I.V.:5660] Out: 1405 [Urine:1225; Drains:80; Blood:100] Intake/Output this shift:    Physical Exam:  General: Alert and oriented. CV: RRR Lungs: Clear bilaterally. GI: Soft, Nondistended. Incisions: Clean, dry, and intact Urine: Clear Extremities: Nontender, no erythema, no edema.  Lab Results:  Recent Labs  09/06/14 1115 09/07/14 0353  HGB 14.9 11.8*  HCT 42.8 33.3*      Assessment/Plan: POD# 1 s/p robotic prostatectomy.  1) SL IVF 2) Ambulate, Incentive spirometry 3) Transition to oral pain medication 4) Dulcolax suppository 5) D/C pelvic drain 6) Recheck H/H although decrease likely dilutional considering stable HR and BP 7) Plan for likely discharge later today   Adam Crane. MD   LOS: 1 day   Adam Crane,LES 09/07/2014, 7:06 AM

## 2015-12-26 IMAGING — CR DG CHEST 2V
2 series · 2 of 2 positions shown · non-contrast
Comparison: 12/16/2013

CLINICAL DATA: Preoperative evaluation for prostate surgery,
history prostate cancer, hypertension

EXAM:
CHEST  2 VIEW

[w chest pa]
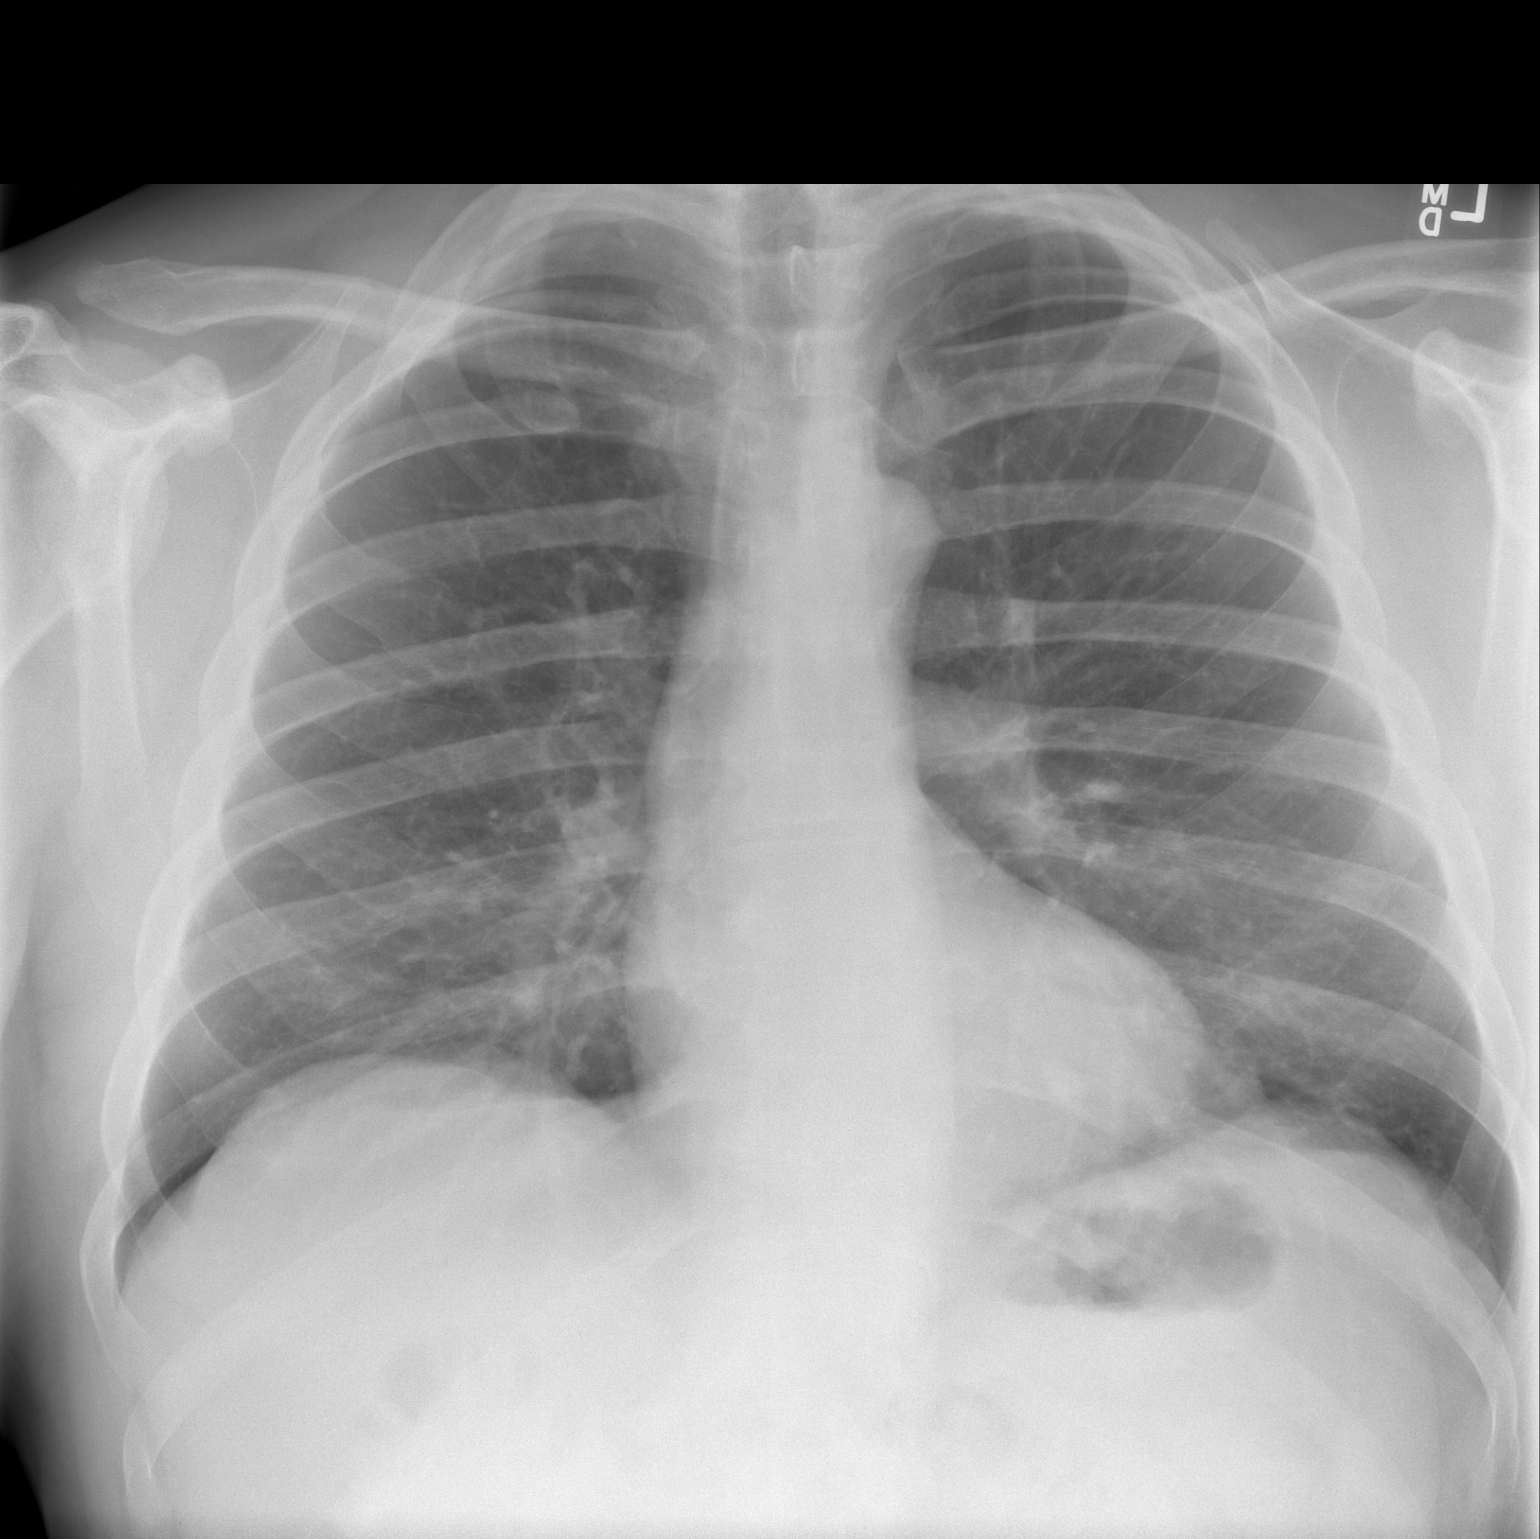

[w chest lat]
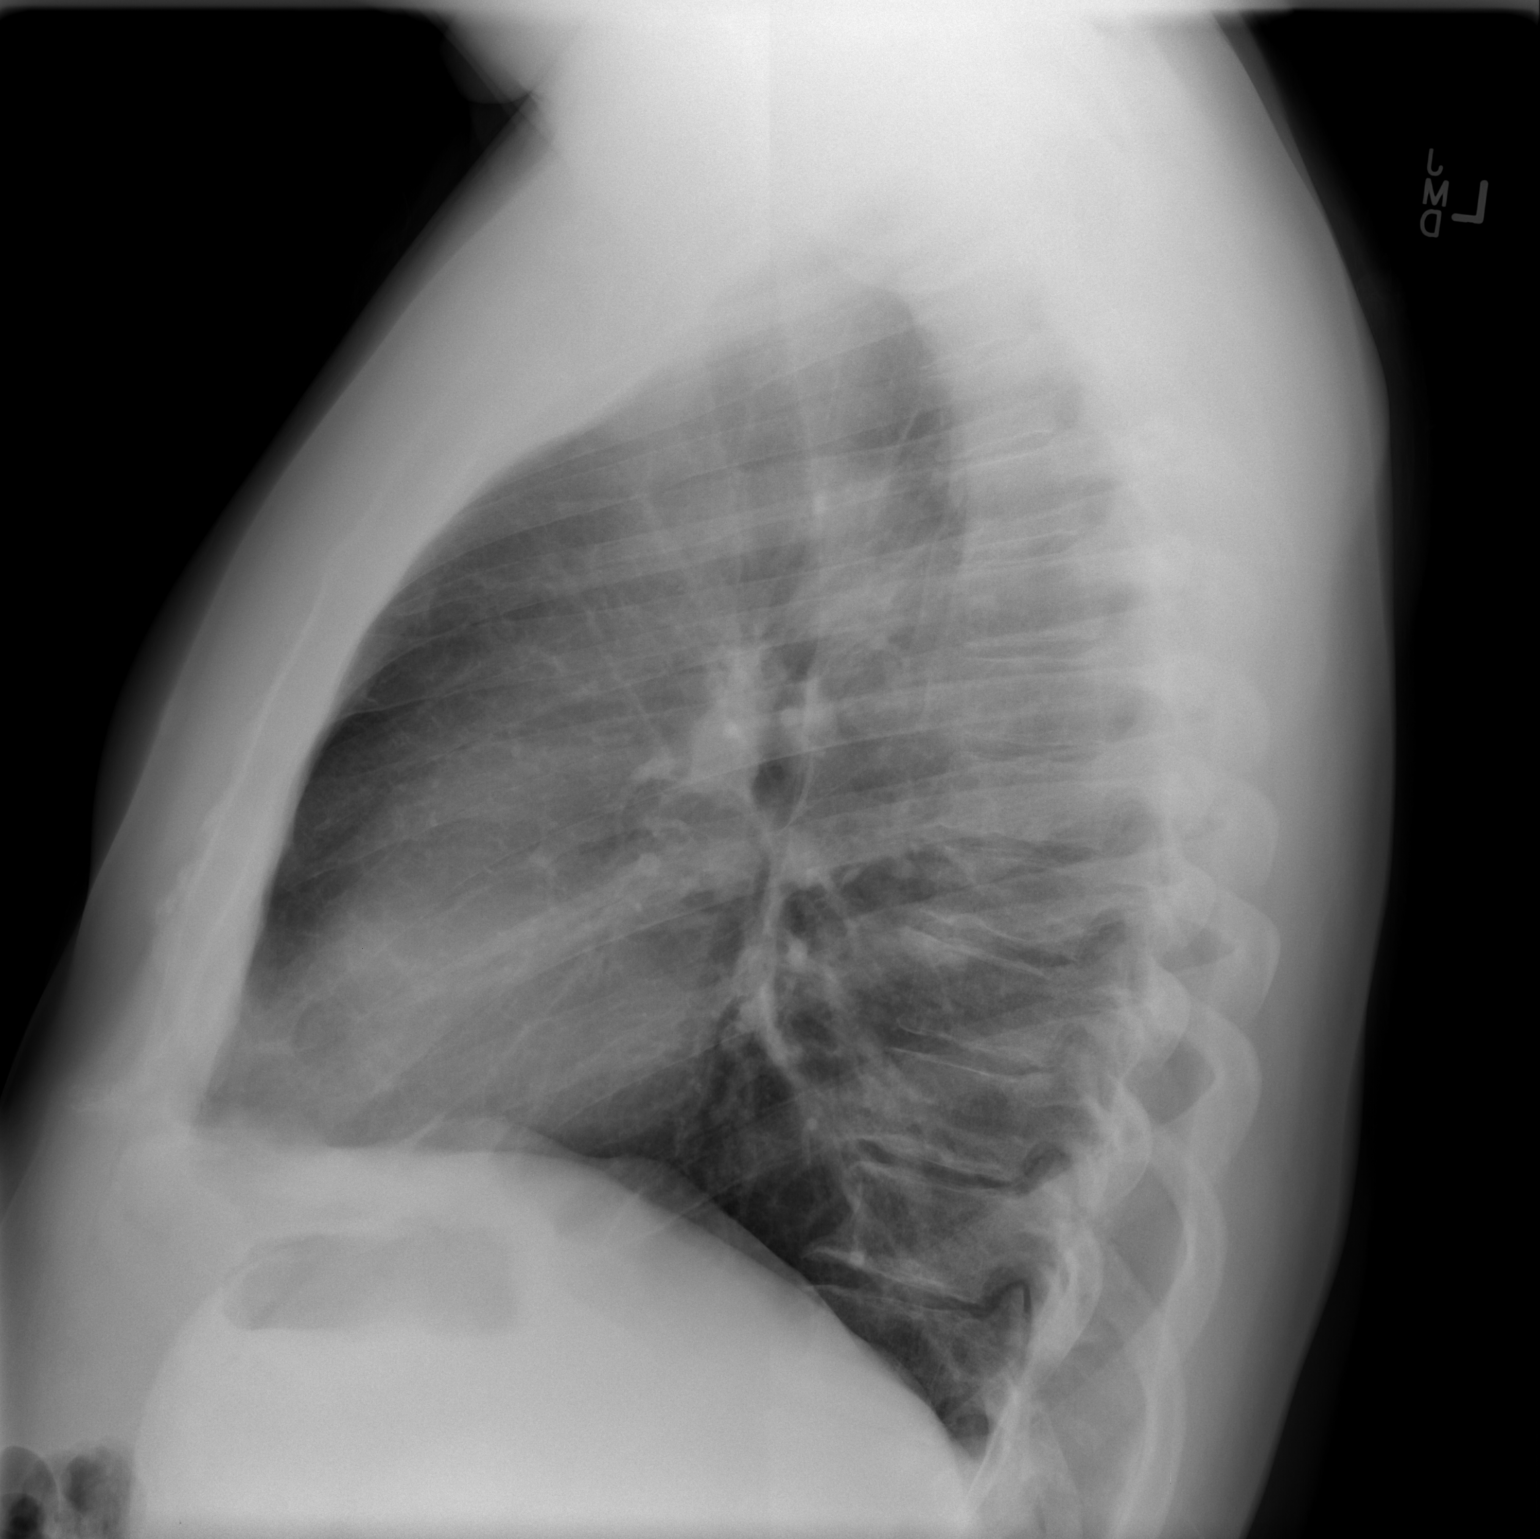

[2 of 2 positions shown; findings below may reference images not displayed]

FINDINGS: Normal heart size, mediastinal contours, and pulmonary vascularity.

Lungs clear.

No pleural effusion or pneumothorax.

BILATERAL cervical ribs.

Hypoplastic anterior RIGHT second rib.

No acute osseous findings.
IMPRESSION: No acute abnormalities.

BILATERAL cervical ribs.

## 2016-10-01 HISTORY — PX: KNEE CARTILAGE SURGERY: SHX688

## 2019-12-15 ENCOUNTER — Other Ambulatory Visit: Payer: Self-pay | Admitting: Internal Medicine

## 2019-12-15 ENCOUNTER — Other Ambulatory Visit: Payer: Self-pay

## 2019-12-15 ENCOUNTER — Ambulatory Visit
Admission: RE | Admit: 2019-12-15 | Discharge: 2019-12-15 | Disposition: A | Discharge status: Home / Self Care | Payer: 59 | Source: Ambulatory Visit | Attending: Internal Medicine | Admitting: Internal Medicine

## 2019-12-15 DIAGNOSIS — R059 Cough, unspecified: Secondary | ICD-10-CM

## 2019-12-15 DIAGNOSIS — R05 Cough: Secondary | ICD-10-CM

## 2019-12-24 ENCOUNTER — Ambulatory Visit: Payer: 59

## 2019-12-25 ENCOUNTER — Ambulatory Visit: Payer: 59 | Attending: Internal Medicine

## 2019-12-25 DIAGNOSIS — Z23 Encounter for immunization: Secondary | ICD-10-CM

## 2019-12-25 NOTE — Progress Notes (Signed)
   Covid-19 Vaccination Clinic  Name:  Adam Crane    MRN: [id number] DOB: [birth year]  12/25/2019  Adam Crane was observed post Covid-19 immunization for 15 minutes without incident. He was provided with Vaccine Information Sheet and instruction to access the V-Safe system.   Adam Crane was instructed to call 911 with any severe reactions post vaccine: Marland Kitchen Difficulty breathing  . Swelling of face and throat  . A fast heartbeat  . A bad rash all over body  . Dizziness and weakness   Immunizations Administered    Name Date Dose VIS Date Route   Pfizer COVID-19 Vaccine 12/25/2019  9:59 AM 0.3 mL 09/11/2019 Intramuscular   Manufacturer: [place]   Lot: [id number]   [place]: [id number]

## 2020-01-19 ENCOUNTER — Ambulatory Visit: Payer: 59 | Attending: Internal Medicine

## 2020-01-19 DIAGNOSIS — Z23 Encounter for immunization: Secondary | ICD-10-CM

## 2020-01-19 NOTE — Progress Notes (Signed)
   Covid-19 Vaccination Clinic  Name:  Adam Crane    MRN: [id number] DOB: 15-Jun-1966  01/19/2020  [name] was observed post Covid-19 immunization for 15 minutes without incident. He was provided with Vaccine Information Sheet and instruction to access the V-Safe system.   [name] was instructed to call 911 with any severe reactions post vaccine: Marland Kitchen Difficulty breathing  . Swelling of face and throat  . A fast heartbeat  . A bad rash all over body  . Dizziness and weakness   Immunizations Administered    Name Date Dose VIS Date Route   Pfizer COVID-19 Vaccine 01/19/2020  9:50 AM 0.3 mL 11/25/2018 Intramuscular   Manufacturer: [place]   Lot: [id number]   [place]: [id number]

## 2020-10-01 HISTORY — PX: DUPUYTREN CONTRACTURE RELEASE: SHX1478

## 2020-10-07 ENCOUNTER — Ambulatory Visit: Payer: 59 | Attending: Internal Medicine

## 2020-10-07 DIAGNOSIS — Z23 Encounter for immunization: Secondary | ICD-10-CM

## 2020-10-07 NOTE — Progress Notes (Signed)
   Covid-19 Vaccination Clinic  Name:  Adam Crane    MRN: [id number] DOB: [birth year]  10/07/2020  Adam Crane was observed post Covid-19 immunization for 15 minutes without incident. He was provided with Vaccine Information Sheet and instruction to access the V-Safe system.   Adam Crane was instructed to call 911 with any severe reactions post vaccine: Marland Kitchen Difficulty breathing  . Swelling of face and throat  . A fast heartbeat  . A bad rash all over body  . Dizziness and weakness   Immunizations Administered    Name Date Dose VIS Date Route   Pfizer COVID-19 Vaccine 10/07/2020  2:57 PM 0.3 mL 07/20/2020 Intramuscular   Manufacturer: [place]   Lot: [id number]   ND[place]: [address]-4

## 2020-11-25 ENCOUNTER — Encounter (HOSPITAL_COMMUNITY): Payer: Self-pay | Admitting: *Deleted

## 2020-11-25 ENCOUNTER — Emergency Department (HOSPITAL_COMMUNITY)
Admission: EM | Admit: 2020-11-25 | Discharge: 2020-11-25 | Disposition: A | Discharge status: Home / Self Care | Payer: Managed Care, Other (non HMO) | Attending: Emergency Medicine | Admitting: Emergency Medicine

## 2020-11-25 ENCOUNTER — Emergency Department (HOSPITAL_COMMUNITY): Payer: Managed Care, Other (non HMO)

## 2020-11-25 DIAGNOSIS — R110 Nausea: Secondary | ICD-10-CM | POA: Diagnosis not present

## 2020-11-25 DIAGNOSIS — I10 Essential (primary) hypertension: Secondary | ICD-10-CM | POA: Diagnosis not present

## 2020-11-25 DIAGNOSIS — R42 Dizziness and giddiness: Secondary | ICD-10-CM | POA: Insufficient documentation

## 2020-11-25 DIAGNOSIS — E039 Hypothyroidism, unspecified: Secondary | ICD-10-CM | POA: Insufficient documentation

## 2020-11-25 DIAGNOSIS — Z79899 Other long term (current) drug therapy: Secondary | ICD-10-CM | POA: Diagnosis not present

## 2020-11-25 DIAGNOSIS — Z8546 Personal history of malignant neoplasm of prostate: Secondary | ICD-10-CM | POA: Insufficient documentation

## 2020-11-25 DIAGNOSIS — R231 Pallor: Secondary | ICD-10-CM | POA: Insufficient documentation

## 2020-11-25 DIAGNOSIS — R079 Chest pain, unspecified: Secondary | ICD-10-CM | POA: Insufficient documentation

## 2020-11-25 LAB — BASIC METABOLIC PANEL
Anion gap: 10 (ref 5–15)
BUN: 11 mg/dL (ref 6–20)
CO2: 25 mmol/L (ref 22–32)
Calcium: 9.8 mg/dL (ref 8.9–10.3)
Chloride: 98 mmol/L (ref 98–111)
Creatinine, Ser: 0.93 mg/dL (ref 0.61–1.24)
GFR, Estimated: >60 mL/min (ref >60)
Glucose, Bld: 103 mg/dL — ABNORMAL HIGH (ref 70–99)
Potassium: 4.1 mmol/L (ref 3.5–5.1)
Sodium: 133 mmol/L — ABNORMAL LOW (ref 135–145)

## 2020-11-25 LAB — HEPATIC FUNCTION PANEL
ALT: 28 U/L (ref 0–44)
AST: 28 U/L (ref 15–41)
Albumin: 3.9 g/dL (ref 3.5–5.0)
Alkaline Phosphatase: 58 U/L (ref 38–126)
Bilirubin, Direct: 0.1 mg/dL (ref 0.0–0.2)
Indirect Bilirubin: 0.9 mg/dL (ref 0.3–0.9)
Total Bilirubin: 1 mg/dL (ref 0.3–1.2)
Total Protein: 6.8 g/dL (ref 6.5–8.1)

## 2020-11-25 LAB — LIPASE, BLOOD: Lipase: 48 U/L (ref 11–51)

## 2020-11-25 LAB — CBC
HCT: 48.4 % (ref 39.0–52.0)
Hemoglobin: 16.7 g/dL (ref 13.0–17.0)
MCH: 33.9 pg (ref 26.0–34.0)
MCHC: 34.5 g/dL (ref 30.0–36.0)
MCV: 98.2 fL (ref 80.0–100.0)
Platelets: 222 10*3/uL (ref 150–400)
RBC: 4.93 MIL/uL (ref 4.22–5.81)
RDW: 11.2 % — ABNORMAL LOW (ref 11.5–15.5)
WBC: 9.7 10*3/uL (ref 4.0–10.5)
nRBC: 0 % (ref 0.0–0.2)

## 2020-11-25 LAB — TROPONIN I (HIGH SENSITIVITY)
Troponin I (High Sensitivity): 14 ng/L (ref <18)
Troponin I (High Sensitivity): 11 ng/L (ref <18)

## 2020-11-25 MED ORDER — LIDOCAINE VISCOUS HCL 2 % MT SOLN
15.0000 mL | Freq: Once | OROMUCOSAL | Status: AC
  Administered 2020-11-25: 15 mL via ORAL
  Filled 2020-11-25: qty 15

## 2020-11-25 MED ORDER — HYOSCYAMINE SULFATE 0.125 MG SL SUBL
0.2500 mg | SUBLINGUAL_TABLET | Freq: Once | SUBLINGUAL | Status: AC
  Administered 2020-11-25: 0.25 mg via SUBLINGUAL
  Filled 2020-11-25: qty 2

## 2020-11-25 MED ORDER — ALUM & MAG HYDROXIDE-SIMETH 200-200-20 MG/5ML PO SUSP
30.0000 mL | Freq: Once | ORAL | Status: AC
Start: 2020-11-25 — End: 2020-11-25
  Administered 2020-11-25: 30 mL via ORAL
  Filled 2020-11-25: qty 30

## 2020-11-25 NOTE — ED Provider Notes (Signed)
[facility] EMERGENCY DEPARTMENT Provider Note   CSN: [id number] Arrival date & time: 11/25/20  0106     History Chief Complaint  Patient presents with  . Abdominal Pain    heartburn    Adam Crane is a 55 y.o. male with a hx of hypertension, hypothyroidism, and prostate cancer status post prostatectomy who presents to the emergency department via EMS with complaints of chest pain that began shortly prior to going to bed last night and worsened around 2330.  Patient states he is having pain to the central chest described as a nonradiating burning sensation.  He took 2 antiacid pills prior to going to bed but the pain seemed to get worse.  Feels like heartburn/indigestion he has had previously but somewhat worse had associated nausea, clamminess sensation, and lightheadedness which concerned him prompting EMS call, he did take 324 mg of Aspirin PTA.  He received a GI cocktail in triage which has significantly improved his pain, states it is now about a 1 out of 10 in severity.  No other alleviating or aggravating factors.  Per nursing staff notes patient had a near syncopal episode over in the radiology department.  I discussed this with the patient, he states that his clamminess/lightheadedness increased but he does not think that he had full loss of consciousness, he could hear staff speaking with him.  He states that currently the symptoms have resolved.  He denies vomiting, syncope, shortness of breath, diarrhea, melena, hematochezia, leg pain/swelling, hemoptysis, recent surgery/trauma, recent long travel, hormone use, or hx of DVT/PE.  He does note that he had soup and salad for dinner, however he did eat a hotdog slightly before this.   HPI     Past Medical History:  Diagnosis Date  . Hypertension   . Hypothyroidism   . Prostate cancer ([place]) 11/04/13   gleason 3+3=6, volume 35 cc  . Thyroid disease    hypothyroid    Patient Active Problem List   Diagnosis  Date Noted  . Prostate cancer [facility])     Past Surgical History:  Procedure Laterality Date  . PROSTATE BIOPSY  11/04/13   gleason 6, vol 35 cc  . ROBOT ASSISTED LAPAROSCOPIC RADICAL PROSTATECTOMY N/A 09/06/2014   Procedure: ROBOTIC ASSISTED LAPAROSCOPIC RADICAL PROSTATECTOMY LEVEL 1;  Surgeon: Raynelle Bring, MD;  Location: [facility] ORS;  Service: Urology;  Laterality: N/A;  . TONSILLECTOMY         Family History  Problem Relation Age of Onset  . Cancer Father        prostate  . Cancer Paternal Grandfather 67       breast, surgery  . Macular degeneration Mother     Social History   Tobacco Use  . Smoking status: Never Smoker  . Smokeless tobacco: Never Used  Substance Use Topics  . Alcohol use: Yes    Comment: 2-3 drinks 5 days per week   . Drug use: No    Home Medications Prior to Admission medications   Medication Sig Start Date End Date Taking? Authorizing Provider  amLODipine (NORVASC) 5 MG tablet Take 5 mg by mouth every morning.     [provider]  ciprofloxacin (CIPRO) 500 MG tablet Take 1 tablet (500 mg total) by mouth 2 (two) times daily. Start day prior to office visit for foley removal 09/06/14   Debbrah Alar, PA-C  HYDROcodone-acetaminophen (NORCO) 5-325 MG per tablet Take 1-2 tablets by mouth every 6 (six) hours as needed. 09/06/14  Debbrah Alar, PA-C  levothyroxine (SYNTHROID, LEVOTHROID) 200 MCG tablet Take 200 mcg by mouth daily before breakfast. Takes with the 58mcg to = 270mcg every morning.    [provider]  levothyroxine (SYNTHROID, LEVOTHROID) 25 MCG tablet Take 25 mcg by mouth daily before breakfast. Takes with the 200 mcg to = 236mcg every morning.    [provider]  losartan (COZAAR) 100 MG tablet Take 100 mg by mouth every morning.     [provider]    Allergies    Patient has no known allergies.  Review of Systems   Review of Systems  Constitutional: Positive for diaphoresis. Negative for fever.   Respiratory: Negative for shortness of breath.   Cardiovascular: Positive for chest pain.  Gastrointestinal: Positive for nausea. Negative for anal bleeding, blood in stool, constipation, diarrhea and vomiting.  Genitourinary: Negative for dysuria.  Neurological: Positive for light-headedness. Negative for syncope.  All other systems reviewed and are negative.   Physical Exam Updated Vital Signs BP (!) 144/81   Pulse 73   Temp 98.8 F (37.1 C) (Oral)   Resp 14   SpO2 96%   Physical Exam Vitals and nursing note reviewed.  Constitutional:      General: He is not in acute distress.    Appearance: He is well-developed. He is not toxic-appearing.  HENT:     Head: Normocephalic and atraumatic.  Eyes:     General:        Right eye: No discharge.        Left eye: No discharge.     Conjunctiva/sclera: Conjunctivae normal.  Cardiovascular:     Rate and Rhythm: Normal rate and regular rhythm.     Comments: 2+ symmetric radial pulses bilaterally. Pulmonary:     Effort: Pulmonary effort is normal. No respiratory distress.     Breath sounds: Normal breath sounds. No wheezing, rhonchi or rales.  Chest:     Chest wall: No tenderness.  Abdominal:     General: There is no distension.     Palpations: Abdomen is soft.     Tenderness: There is no abdominal tenderness. There is no guarding or rebound.  Musculoskeletal:     Cervical back: Neck supple.     Right lower leg: No edema.     Left lower leg: No edema.  Skin:    General: Skin is warm and dry.     Findings: No rash.  Neurological:     Mental Status: He is alert.     Comments: Clear speech.  CN III to XII grossly intact.  Sensation and strength grossly intact x4.  Psychiatric:        Behavior: Behavior normal.     ED Results / Procedures / Treatments   Labs (all labs ordered are listed, but only abnormal results are displayed) Labs Reviewed  BASIC METABOLIC PANEL - Abnormal; Notable for the following components:       Result Value   Sodium 133 (*)    Glucose, Bld 103 (*)    All other components within normal limits  CBC - Abnormal; Notable for the following components:   RDW 11.2 (*)    All other components within normal limits  LIPASE, BLOOD  HEPATIC FUNCTION PANEL  TROPONIN I (HIGH SENSITIVITY)  TROPONIN I (HIGH SENSITIVITY)    EKG EKG Interpretation  Date/Time:  Friday November 25 2020 01:10:43 EST Ventricular Rate:  83 PR Interval:  154 QRS Duration: 104 QT Interval:  376 QTC Calculation: 441  R Axis:   61 Text Interpretation: Sinus rhythm with occasional Premature ventricular complexes Incomplete right bundle branch block Cannot rule out Anterior infarct , age undetermined Abnormal ECG Otherwise no significant change Confirmed by Addison Lank [id number]) on 11/25/2020 1:44:14 AM   Radiology DG Chest 2 View  Result Date: 11/25/2020 CLINICAL DATA:  Diaphoresis and epigastric pain. EXAM: CHEST - 2 VIEW COMPARISON:  12/15/2019 FINDINGS: The heart size and mediastinal contours are within normal limits. Both lungs are clear. The visualized skeletal structures are unremarkable. IMPRESSION: No active cardiopulmonary disease. Electronically Signed   By: Nelson Chimes M.D.   On: 11/25/2020 01:53    Procedures Procedures   Medications Ordered in ED Medications  alum & mag hydroxide-simeth (MAALOX/MYLANTA) 200-200-20 MG/5ML suspension 30 mL (30 mLs Oral Given 11/25/20 0118)    And  lidocaine (XYLOCAINE) 2 % viscous mouth solution 15 mL (15 mLs Oral Given 11/25/20 0119)    ED Course  I have reviewed the triage vital signs and the nursing notes.  Pertinent labs & imaging results that were available during my care of the patient were reviewed by me and considered in my medical decision making (see chart for details).    MDM Rules/Calculators/A&P                         Patient presents to the emergency department with chest pain. Patient nontoxic appearing, in no apparent distress, vitals without  significant abnormality-BP somewhat elevated on arrival.  DDX: ACS, pulmonary embolism, dissection, pneumothorax, pneumonia, arrhythmia, severe anemia, MSK, GERD, cholecystitis, cholelithiasis, pancreatitis, PUD, anxiety.   EKG: No significant change compared to prior, no STEMI  Lab Tests:  I reviewed and interpreted labs, which included:  CBC: Unremarkable BMP: No significant electrolyte derangement, mild hyponatremia at 133 Hepatic function panel: Within normal limits Lipase: Within normal limits Troponin: No significant elevation, flat  Imaging Studies ordered:  CXR ordered per triage, I independently reviewed, formal radiology impression shows: No active cardiopulmonary disease  Heart Pathway Score 4, Ekg without acute change compared to prior, delta troponin negative, no exertional component, doubt ACS. Patient is low risk wells, doubt pulmonary embolism. Pain is not a tearing sensation, symmetric pulses, no widening of mediastinum on CXR, doubt dissection. Abdomen nontedner without peritoneal, reassuring labs, doubt acute surgical process. CXR reassuring.  Cardiac monitor reviewed, no notable arrhythmias.  Has remained without symptoms of nausea, clamminess, or lightheadedness since my initial evaluation.  He states he is no longer having significant pain, he does state that he thinks this was probably something that he ate as his stomach just does not feel quite right, will give sublingual high Cosamin for this.  Patient feeling much better, he is tolerating p.o., he feels ready to go home.  Will start on PPI provide Carafate with PCP follow-up.. I discussed results, treatment plan, need for follow-up, and return precautions with the patient. Provided opportunity for questions, patient confirmed understanding and is in agreement with plan.   Findings and plan of care discussed with supervising physician Dr. Leonette Monarch who is in agreement.   Portions of this note were generated with Geographical information systems officer. Dictation errors may occur despite best attempts at proofreading.  Final Clinical Impression(s) / ED Diagnoses Final diagnoses:  Chest pain, unspecified type    Rx / DC Orders ED Discharge Orders    None       Amaryllis Dyke, PA-C 11/25/20 0545    Fatima Blank, MD 11/25/20  0742  

## 2020-11-25 NOTE — ED Notes (Signed)
Patient verbalizes understanding of discharge instructions. Follow-up care reviewed. Opportunity for questioning and answers were provided. Armband removed by staff, pt discharged from ED ambulatory.  

## 2020-11-25 NOTE — Discharge Instructions (Addendum)
You were seen in the emergency department today for chest pain. Your work-up in the emergency department has been overall reassuring. Your labs have been fairly normal and or similar to previous blood work you have had done. Your EKG and the enzyme we use to check your heart did not show an acute heart attack at this time. Your chest x-ray was normal.   We are sending you home with the following medications to help with your symptoms:  - Protonix- please take 1 tablet in the morning prior to any meals to help with stomach acidity/pain.  - Carafate- please take prior to each meal and prior to bedtime to help with stomach acidity/pain.   We have prescribed you new medication(s) today. Discuss the medications prescribed today with your pharmacist as they can have adverse effects and interactions with your other medicines including over the counter and prescribed medications. Seek medical evaluation if you start to experience new or abnormal symptoms after taking one of these medicines, seek care immediately if you start to experience difficulty breathing, feeling of your throat closing, facial swelling, or rash as these could be indications of a more serious allergic reaction  Please follow attached diet guidelines.   We would like you to follow up closely with your primary care provider and/or the cardiologist provided in your discharge instructions within 1-3 days. Return to the ER immediately should you experience any new or worsening symptoms including but not limited to return of pain, worsened pain, vomiting, blood in your vomit or stool, shortness of breath, dizziness, lightheadedness, passing out, or any other concerns that you may have.

## 2020-11-25 NOTE — ED Triage Notes (Signed)
Pt here from home by ems due to severe epigastric pain/heartburn.  Pt had some diaphoresis with this and felt awful, relief with belching.  Pt took asa pta.  Pt is alert and oriented, no cardiac history.

## 2020-11-25 NOTE — ED Notes (Signed)
Xray comes to triage for nursing help as pt had a syncopal episode on arrival to triage.  I went to check on pt and found that he was alert and oriented, diaphoretic but awake.  Pt states that he began feeling unwell and weak and sweaty on arrival to xray but feels better now.  Epigastic pain is now gone. Pt will be moved to hall bed.

## 2020-12-28 ENCOUNTER — Other Ambulatory Visit: Payer: Self-pay

## 2021-08-14 ENCOUNTER — Encounter: Payer: Self-pay | Admitting: Gastroenterology

## 2021-09-27 ENCOUNTER — Ambulatory Visit (AMBULATORY_SURGERY_CENTER): Payer: Managed Care, Other (non HMO)

## 2021-09-27 ENCOUNTER — Other Ambulatory Visit: Payer: Self-pay

## 2021-09-27 VITALS — Ht 74.0 in | Wt 255.0 lb

## 2021-09-27 DIAGNOSIS — Z1211 Encounter for screening for malignant neoplasm of colon: Secondary | ICD-10-CM

## 2021-09-27 MED ORDER — NA SULFATE-K SULFATE-MG SULF 17.5-3.13-1.6 GM/177ML PO SOLN: 1.0000 | Freq: Once | ORAL | 0 refills | Status: AC

## 2021-09-27 NOTE — Progress Notes (Signed)
Pre visit completed via phone call; Patient verified name, DOB, and address; No egg or soy allergy known to patient  No issues known to pt with past sedation with any surgeries or procedures Patient denies ever being told they had issues or difficulty with intubation  No FH of Malignant Hyperthermia Pt is not on diet pills Pt is not on home 02  Pt is not on blood thinners  Pt denies issues with constipation  No A fib or A flutter Pt is fully vaccinated for Covid x 2;  NO PA's for preps discussed with pt in PV today  Discussed with pt there will be an out-of-pocket cost for prep and that varies from $0 to 70 + dollars - pt verbalized understanding  Due to the COVID-19 pandemic we are asking patients to follow certain guidelines in PV and the [facility]   Pt aware of COVID protocols and [facility] guidelines

## 2021-10-11 ENCOUNTER — Encounter: Payer: Self-pay | Admitting: Gastroenterology

## 2021-10-16 ENCOUNTER — Encounter: Payer: Self-pay | Admitting: Gastroenterology

## 2021-10-16 ENCOUNTER — Ambulatory Visit (AMBULATORY_SURGERY_CENTER): Payer: 59 | Admitting: Gastroenterology

## 2021-10-16 VITALS — BP 134/78 | HR 59 | Temp 97.5°F | Resp 18 | Ht 74.0 in | Wt 255.0 lb

## 2021-10-16 DIAGNOSIS — Z1211 Encounter for screening for malignant neoplasm of colon: Secondary | ICD-10-CM

## 2021-10-16 DIAGNOSIS — D122 Benign neoplasm of ascending colon: Secondary | ICD-10-CM

## 2021-10-16 DIAGNOSIS — D123 Benign neoplasm of transverse colon: Secondary | ICD-10-CM

## 2021-10-16 MED ORDER — SODIUM CHLORIDE 0.9 % IV SOLN: 500.0000 mL | Freq: Once | INTRAVENOUS | Status: DC

## 2021-10-16 NOTE — Op Note (Signed)
[facility] Patient Name: Adam Crane Procedure Date: 10/16/2021 8:33 AM MRN: [id number] Endoscopist: Nicki Reaper E. Candis Schatz , MD Age: 56 Referring MD:  Date of Birth: 28-Sep-1966 Gender: Male Account #: [id number] Procedure:                Colonoscopy Indications:              Screening for colorectal malignant neoplasm, This                            is the patient's first colonoscopy Medicines:                Monitored Anesthesia Care Procedure:                Pre-Anesthesia Assessment:                           - Prior to the procedure, a History and Physical                            was performed, and patient medications and                            allergies were reviewed. The patient's tolerance of                            previous anesthesia was also reviewed. The risks                            and benefits of the procedure and the sedation                            options and risks were discussed with the patient.                            All questions were answered, and informed consent                            was obtained. Prior Anticoagulants: The patient has                            taken no previous anticoagulant or antiplatelet                            agents. ASA Grade Assessment: II - A patient with                            mild systemic disease. After reviewing the risks                            and benefits, the patient was deemed in                            satisfactory condition to undergo the procedure.  After obtaining informed consent, the colonoscope                            was passed under direct vision. Throughout the                            procedure, the patient's blood pressure, pulse, and                            oxygen saturations were monitored continuously. The                            CF [id number] #[id number] was introduced through the anus                            and advanced to the  the terminal ileum, with                            identification of the appendiceal orifice and IC                            valve. The colonoscopy was performed without                            difficulty. The patient tolerated the procedure                            well. The quality of the bowel preparation was                            adequate. The terminal ileum, ileocecal valve,                            appendiceal orifice, and rectum were photographed.                            The bowel preparation used was SUPREP via split                            dose instruction. Scope In: 8:51:40 AM Scope Out: 9:11:23 AM Scope Withdrawal Time: 0 hours 13 minutes 42 seconds  Total Procedure Duration: 0 hours 19 minutes 43 seconds  Findings:                 Hemorrhoids were found on perianal exam.                           The digital rectal exam was normal. Pertinent                            negatives include normal sphincter tone and no                            palpable rectal lesions.  Two sessile polyps were found in the ascending                            colon. The polyps were 2 to 3 mm in size. These                            polyps were removed with a cold snare. Resection                            and retrieval were complete. Estimated blood loss                            was minimal.                           Two sessile polyps were found in the splenic                            flexure. The polyps were 3 to 8 mm in size. These                            polyps were removed with a cold snare. Resection                            and retrieval were complete. Estimated blood loss                            was minimal.                           Many small and large-mouthed diverticula were found                            in the sigmoid colon, descending colon, transverse                            colon and ascending colon.                            The exam was otherwise normal throughout the                            examined colon.                           The terminal ileum appeared normal.                           Non-bleeding internal hemorrhoids were found during                            retroflexion. The hemorrhoids were Grade I                            (internal hemorrhoids  that do not prolapse).                           No additional abnormalities were found on                            retroflexion. Complications:            No immediate complications. Estimated Blood Loss:     Estimated blood loss was minimal. Impression:               - Hemorrhoids found on perianal exam.                           - Two 2 to 3 mm polyps in the ascending colon,                            removed with a cold snare. Resected and retrieved.                           - Two 3 to 8 mm polyps at the splenic flexure,                            removed with a cold snare. Resected and retrieved.                           - Diverticulosis in the sigmoid colon, in the                            descending colon, in the transverse colon and in                            the ascending colon.                           - The examined portion of the ileum was normal.                           - Non-bleeding internal hemorrhoids. Recommendation:           - Patient has a contact number available for                            emergencies. The signs and symptoms of potential                            delayed complications were discussed with the                            patient. Return to normal activities tomorrow.                            Written discharge instructions were provided to the  patient.                           - Resume previous diet.                           - Continue present medications.                           - Await pathology results.                           - Repeat colonoscopy  (date not yet determined) for                            surveillance based on pathology results. Dayana Dalporto E. Candis Schatz, MD 10/16/2021 9:20:58 AM This report has been signed electronically.

## 2021-10-16 NOTE — Progress Notes (Signed)
Called to room to assist during endoscopic procedure.  Patient ID and intended procedure confirmed with present staff. Received instructions for my participation in the procedure from the performing physician.  

## 2021-10-16 NOTE — Progress Notes (Signed)
To pacu, VSS. Report to [email or web address].tb 

## 2021-10-16 NOTE — Progress Notes (Signed)
Pt had previsit, states feels his lips have been having some swelling/tingling when he takes his Irbesartan, pt will call his PCP today to discuss.  Vitals DT

## 2021-10-16 NOTE — Progress Notes (Signed)
[facility] History and Physical   Primary Care Physician:  Wenda Low, MD   Reason for Procedure:   Colon cancer screening  Plan:    Screening colonoscopy     HPI: Adam Crane is a 56 y.o. male undergoing initial average risk screening colonoscopy.  He has no family history of colon cancer and no chronic GI symptoms.  He had transient lip tingling and swelling yesterday which he thinks is related to his irbesartan (started about a month ago).  He feels well today, no swelling/tingling.  No respiratory symptoms yesterday or today.   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 Authorizing Provider  amLODipine (NORVASC) 5 MG tablet Take 5 mg by mouth every morning.    Yes [provider]  aspirin 81 MG EC tablet Take 1 tablet by mouth daily at 6 (six) AM.   Yes [provider]  HYDROCHLOROTHIAZIDE PO Take by mouth.   Yes [provider]  irbesartan (AVAPRO) 300 MG tablet Take 300 mg by mouth daily. 09/06/21  Yes [provider]  levothyroxine (SYNTHROID, LEVOTHROID) 200 MCG tablet Take 200 mcg by mouth daily before breakfast. Takes with the 54mcg to = 232mcg every morning.   Yes  [provider]  levothyroxine (SYNTHROID, LEVOTHROID) 25 MCG tablet Take 25 mcg by mouth daily before breakfast. Takes with the 200 mcg to = 258mcg every morning.   Yes [provider]  Multiple Vitamin (MULTIVITAMIN) capsule Take 1 capsule by mouth daily.   Yes [provider]  sildenafil (VIAGRA) 50 MG tablet 1 tablet as needed 11/29/08  Yes [provider]  vitamin E 1000 UNIT capsule Take 1 capsule by mouth daily at 6 (six) AM.   Yes [provider]    Current Outpatient Medications  Medication Sig Dispense Refill   amLODipine (NORVASC) 5 MG tablet Take 5 mg by mouth every morning.      aspirin 81 MG EC tablet Take 1 tablet by mouth daily at 6 (six) AM.     HYDROCHLOROTHIAZIDE PO Take by mouth.     irbesartan (AVAPRO) 300 MG tablet Take 300 mg by mouth daily.     levothyroxine (SYNTHROID, LEVOTHROID) 200 MCG tablet Take 200 mcg by mouth daily before breakfast. Takes with the 17mcg to = 270mcg every morning.     levothyroxine (SYNTHROID, LEVOTHROID) 25 MCG tablet Take 25 mcg by mouth daily before breakfast. Takes with the 200 mcg to = 249mcg every morning.     Multiple Vitamin (MULTIVITAMIN) capsule Take 1 capsule by mouth daily.     sildenafil (VIAGRA) 50 MG tablet 1 tablet as  needed     vitamin E 1000 UNIT capsule Take 1 capsule by mouth daily at 6 (six) AM.     Current Facility-Administered Medications  Medication Dose Route Frequency Provider Last Rate Last Admin   0.9 %  sodium chloride infusion  500 mL Intravenous Once Daryel November, MD        Allergies as of 10/16/2021   (No Known Allergies)    Family History  Problem Relation Age of Onset   Macular degeneration Mother    Cancer Father        prostate   Cancer Paternal Grandfather 68       breast, surgery   Colon polyps Neg Hx    Colon cancer Neg Hx    Esophageal cancer Neg Hx    Rectal cancer Neg Hx    Stomach cancer Neg Hx     Social History   Socioeconomic  History   Marital status: Married    Spouse name: Not on file   Number of children: Not on file   Years of education: Not on file   Highest education level: Not on file  Occupational History   Not on file  Tobacco Use   Smoking status: Never   Smokeless tobacco: Never  Vaping Use   Vaping Use: Never used  Substance and Sexual Activity   Alcohol use: Yes    Alcohol/week: 10.0 standard drinks    Types: 10 Standard drinks or equivalent per week   Drug use: No   Sexual activity: Not on file  Other Topics Concern   Not on file  Social History Narrative   Not on file   Social Determinants of Health   Financial Resource Strain: Not on file  Food Insecurity: Not on file  Transportation Needs: Not on file  Physical Activity: Not on file  Stress: Not on file  Social Connections: Not on file  Intimate Partner Violence: Not on file    Review of Systems:  All other review of systems negative except as mentioned in the HPI.  Physical Exam: Vital signs BP (!) 148/80    Pulse 72    Temp (!) 97.5 F (36.4 C) (Temporal)    Ht 6\' 2"  (1.88 m)    Wt 255 lb (115.7 kg)    SpO2 97%    BMI 32.74 kg/m   General:   Alert,  Well-developed, well-nourished, pleasant and cooperative in NAD Airway:  Mallampati 3 Lungs:  Clear throughout to auscultation.   Heart:  Regular rate and rhythm; no murmurs, clicks, rubs,  or gallops. Abdomen:  Soft, nontender and nondistended. Normal bowel sounds.   Neuro/Psych:  Normal mood and affect. A and O x 3   Nixxon Faria E. Candis Schatz, MD [facility]

## 2021-10-16 NOTE — Progress Notes (Signed)
No problems noted in the recovery room. maw 

## 2021-10-16 NOTE — Patient Instructions (Addendum)
Handouts were given to your care partner on polyps, diverticulosis, and hemorrhoids. You may resume your current medications today. Await biopsy results.  May take 1-3 weeks to receive pathology results. Please call if any questions or concerns.      YOU HAD AN ENDOSCOPIC PROCEDURE TODAY AT [facility]:   Refer to the procedure report that was given to you for any specific questions about what was found during the examination.  If the procedure report does not answer your questions, please call your gastroenterologist to clarify.  If you requested that your care partner not be given the details of your procedure findings, then the procedure report has been included in a sealed envelope for you to review at your convenience later.  YOU SHOULD EXPECT: Some feelings of bloating in the abdomen. Passage of more gas than usual.  Walking can help get rid of the air that was put into your GI tract during the procedure and reduce the bloating. If you had a lower endoscopy (such as a colonoscopy or flexible sigmoidoscopy) you may notice spotting of blood in your stool or on the toilet paper. If you underwent a bowel prep for your procedure, you may not have a normal bowel movement for a few days.  Please Note:  You might notice some irritation and congestion in your nose or some drainage.  This is from the oxygen used during your procedure.  There is no need for concern and it should clear up in a day or so.  SYMPTOMS TO REPORT IMMEDIATELY:  Following lower endoscopy (colonoscopy or flexible sigmoidoscopy):  Excessive amounts of blood in the stool  Significant tenderness or worsening of abdominal pains  Swelling of the abdomen that is new, acute  Fever of 100F or higher   For urgent or emergent issues, a gastroenterologist can be reached at any hour by calling [phone]. Do not use MyChart messaging for urgent concerns.    DIET:  We do recommend a small meal at first, but then  you may proceed to your regular diet.  Drink plenty of fluids but you should avoid alcoholic beverages for 24 hours.  ACTIVITY:  You should plan to take it easy for the rest of today and you should NOT DRIVE or use heavy machinery until tomorrow (because of the sedation medicines used during the test).    FOLLOW UP: Our staff will call the number listed on your records 48-72 hours following your procedure to check on you and address any questions or concerns that you may have regarding the information given to you following your procedure. If we do not reach you, we will leave a message.  We will attempt to reach you two times.  During this call, we will ask if you have developed any symptoms of COVID 19. If you develop any symptoms (ie: fever, flu-like symptoms, shortness of breath, cough etc.) before then, please call [phone].  If you test positive for Covid 19 in the 2 weeks post procedure, please call and report this information to [place].    If any biopsies were taken you will be contacted by phone or by letter within the next 1-3 weeks.  Please call us at [phone] if you have not heard about the biopsies in 3 weeks.    SIGNATURES/CONFIDENTIALITY: You and/or your care partner have signed paperwork which will be entered into your electronic medical record.  These signatures attest to the fact that that the information above on your After  Visit Summary has been reviewed and is understood.  Full responsibility of the confidentiality of this discharge information lies with you and/or your care-partner.

## 2021-10-18 ENCOUNTER — Telehealth: Payer: Self-pay | Admitting: *Deleted

## 2021-10-18 NOTE — Telephone Encounter (Signed)
°  Follow up Call-  Call back number 10/16/2021  Post procedure Call Back phone  # [phone]  Permission to leave phone message Yes  Some recent data might be hidden     Patient questions:  Do you have a fever, pain , or abdominal swelling? No. Pain Score  0 *  Have you tolerated food without any problems? Yes.    Have you been able to return to your normal activities? Yes.    Do you have any questions about your discharge instructions: Diet   No. Medications  No. Follow up visit  No.  Do you have questions or concerns about your Care? No.  Actions: * If pain score is 4 or above: No action needed, pain <4.

## 2021-10-21 ENCOUNTER — Encounter: Payer: Self-pay | Admitting: Gastroenterology

## 2021-12-29 ENCOUNTER — Other Ambulatory Visit: Payer: Self-pay | Admitting: Internal Medicine

## 2021-12-29 DIAGNOSIS — R229 Localized swelling, mass and lump, unspecified: Secondary | ICD-10-CM

## 2022-01-04 ENCOUNTER — Ambulatory Visit
Admission: RE | Admit: 2022-01-04 | Discharge: 2022-01-04 | Disposition: A | Discharge status: Home / Self Care | Payer: 59 | Source: Ambulatory Visit | Attending: Internal Medicine | Admitting: Internal Medicine

## 2022-01-04 DIAGNOSIS — R229 Localized swelling, mass and lump, unspecified: Secondary | ICD-10-CM

## 2022-03-14 DIAGNOSIS — I10 Essential (primary) hypertension: Secondary | ICD-10-CM | POA: Diagnosis not present

## 2022-03-14 DIAGNOSIS — Z23 Encounter for immunization: Secondary | ICD-10-CM | POA: Diagnosis not present

## 2022-03-21 IMAGING — CR DG CHEST 2V
2 series · 2 of 2 positions shown · non-contrast
Comparison: 12/15/2019

CLINICAL DATA: Diaphoresis and epigastric pain.

EXAM:
CHEST - 2 VIEW

[chest lat]
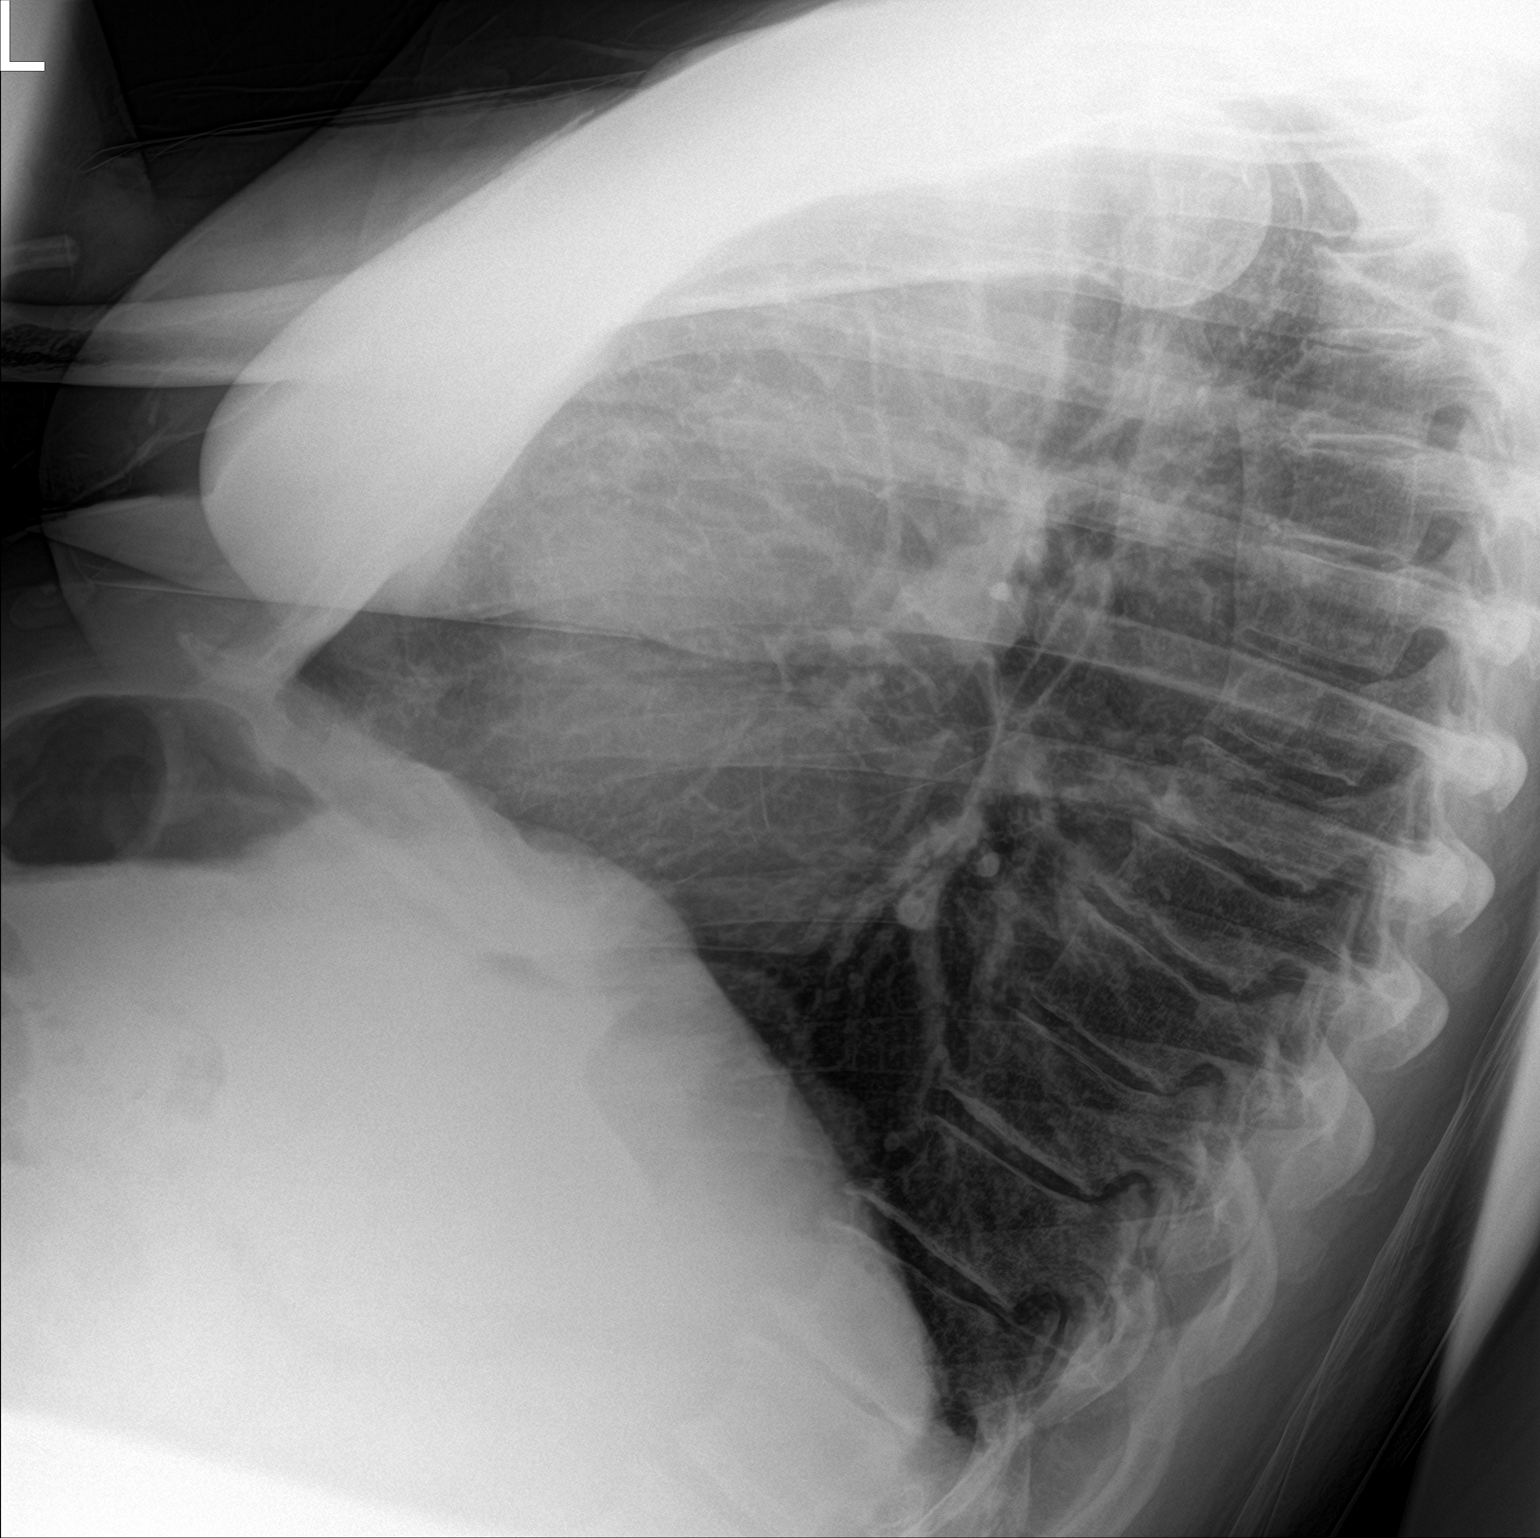

[chest ap]
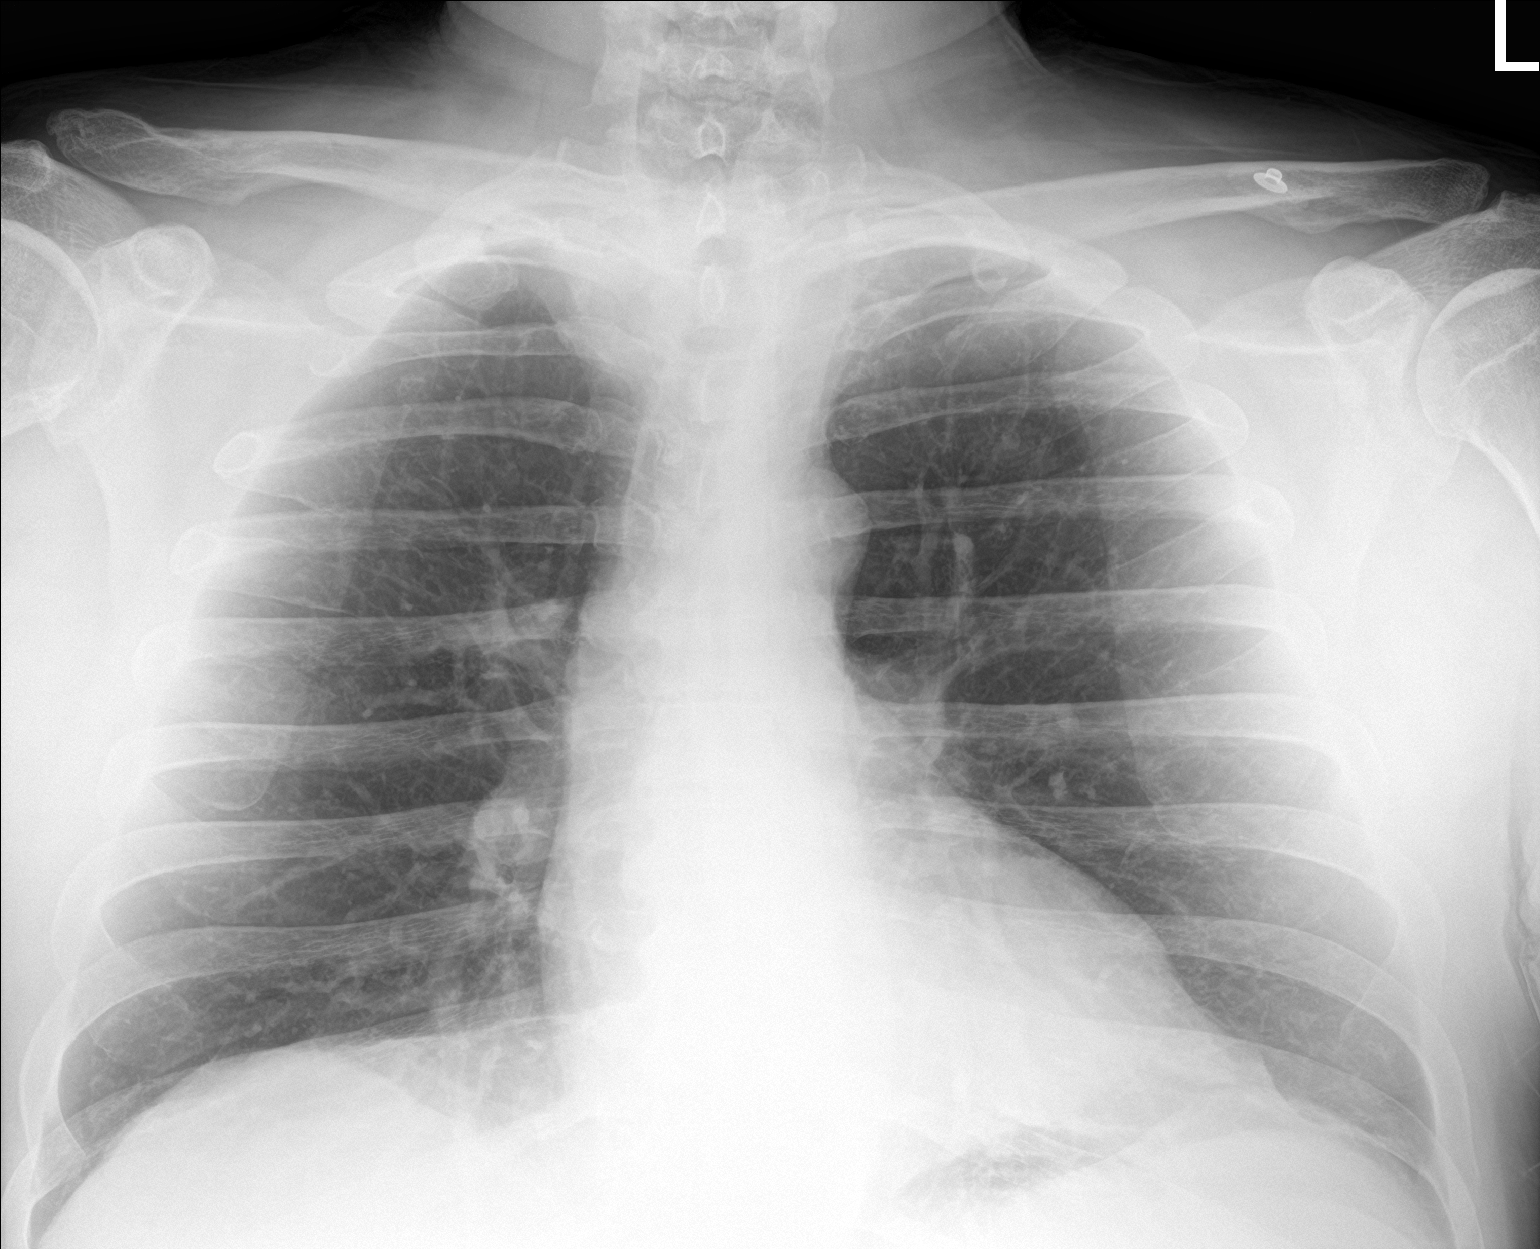

[2 of 2 positions shown; findings below may reference images not displayed]

FINDINGS: The heart size and mediastinal contours are within normal limits.
Both lungs are clear. The visualized skeletal structures are
unremarkable.
IMPRESSION: No active cardiopulmonary disease.

## 2022-07-09 DIAGNOSIS — M25551 Pain in right hip: Secondary | ICD-10-CM | POA: Diagnosis not present

## 2022-07-27 DIAGNOSIS — R21 Rash and other nonspecific skin eruption: Secondary | ICD-10-CM | POA: Diagnosis not present

## 2022-10-04 DIAGNOSIS — E039 Hypothyroidism, unspecified: Secondary | ICD-10-CM | POA: Diagnosis not present

## 2022-10-04 DIAGNOSIS — M1611 Unilateral primary osteoarthritis, right hip: Secondary | ICD-10-CM | POA: Diagnosis not present

## 2022-10-04 DIAGNOSIS — E669 Obesity, unspecified: Secondary | ICD-10-CM | POA: Diagnosis not present

## 2022-10-04 DIAGNOSIS — I10 Essential (primary) hypertension: Secondary | ICD-10-CM | POA: Diagnosis not present

## 2022-10-24 ENCOUNTER — Encounter: Payer: Self-pay | Admitting: Orthopaedic Surgery

## 2022-10-24 ENCOUNTER — Ambulatory Visit (INDEPENDENT_AMBULATORY_CARE_PROVIDER_SITE_OTHER): Payer: BC Managed Care – PPO

## 2022-10-24 ENCOUNTER — Ambulatory Visit (INDEPENDENT_AMBULATORY_CARE_PROVIDER_SITE_OTHER): Payer: BC Managed Care – PPO | Admitting: Orthopaedic Surgery

## 2022-10-24 VITALS — Ht 74.0 in | Wt 279.0 lb

## 2022-10-24 DIAGNOSIS — M25551 Pain in right hip: Secondary | ICD-10-CM

## 2022-10-24 NOTE — Progress Notes (Signed)
The patient is a very pleasant and active 57 year old gentleman who essentially from his primary care physician Dr. Lysle Rubens to evaluate and treat known arthritis of the patient's right hip.  He has been having worsening hip pain and groin pain for 4 to 5 months now but it is really been getting stiff and painful for well over a year.  He has tried conservative treatment including activity modification, anti-inflammatories, rest, hip strengthening exercises and time.  He is at the point where this is detrimentally affecting his mobility, his quality of life and his actives daily living.  He has a very hard time putting on his shoes and socks on that side or crossing his leg.  At this point he is sent to me to consider surgical options.  He currently denies any headache, chest pain, shortness of breath, fever, chills, nausea, vomiting.  I was able to review all of his notes and medications within epic.  He does have high blood pressure and is on medications for this.  They are concerned of long-term anti-inflammatories as the effect on blood pressure and kidneys.  On examination his right hip is significantly stiff with limitations in internal and external rotation.  There is significant pain in the groin and the lateral aspect of his hip with rotation of the right hip.  The left hip is stiff but has better range of motion and no pain at all on range of motion.  A standing AP pelvis and lateral of the right hip shows severe end-stage arthritis of the right hip.  There is bone-on-bone wear of the articular weightbearing surface of the hip.  There is some slight flattening of the femoral head and evidence of previous femoral acetabular impingement.  There are osteophytes around the hip joint as well.  The left hip has some slight narrowing.  At this point he is not a candidate for an intra-articular injection nor he is a candidate for outpatient physical therapy given the severity of his arthritis especially  involving the weightbearing surface of the joint.  I have recommended hip replacement.  I explained in detail in length why we are recommending this and the risk and benefits of surgery.  I gave him a handout about hip replacement surgery and showed him a hip replacement model.  We discussed what to expect from an intraoperative and postoperative course.  All questions and concerns were answered and addressed.  We will work on getting this scheduled.

## 2022-12-04 DIAGNOSIS — E039 Hypothyroidism, unspecified: Secondary | ICD-10-CM | POA: Diagnosis not present

## 2022-12-06 ENCOUNTER — Other Ambulatory Visit: Payer: Self-pay

## 2022-12-21 NOTE — Progress Notes (Signed)
COVID Vaccine Completed: yes  Date of COVID positive in last 90 days:  PCP - Wenda Low, MD Cardiologist -   Chest x-ray -  EKG -  Stress Test -  ECHO -  Cardiac Cath -  Pacemaker/ICD device last checked: Spinal Cord Stimulator:  Bowel Prep -   Sleep Study -  CPAP -   Fasting Blood Sugar -  Checks Blood Sugar _____ times a day  Last dose of GLP1 agonist-  N/A GLP1 instructions:  N/A   Last dose of SGLT-2 inhibitors-  N/A SGLT-2 instructions: N/A   Blood Thinner Instructions: Aspirin Instructions: ASA 81 Last Dose:  Activity level:  Can go up a flight of stairs and perform activities of daily living without stopping and without symptoms of chest pain or shortness of breath.  Able to exercise without symptoms  Unable to go up a flight of stairs without symptoms of     Anesthesia review:   Patient denies shortness of breath, fever, cough and chest pain at PAT appointment  Patient verbalized understanding of instructions that were given to them at the PAT appointment. Patient was also instructed that they will need to review over the PAT instructions again at home before surgery.

## 2022-12-21 NOTE — Progress Notes (Signed)
Please place orders for PAT appointment scheduled 12/24/22.

## 2022-12-21 NOTE — Patient Instructions (Signed)
SURGICAL WAITING ROOM VISITATION  Patients having surgery or a procedure may have no more than 2 support people in the waiting area - these visitors may rotate.    Children under the age of 68 must have an adult with them who is not the patient.  Due to an increase in RSV and influenza rates and associated hospitalizations, children ages 60 and under may not visit patients in [facility].  If the patient needs to stay at the hospital during part of their recovery, the visitor guidelines for inpatient rooms apply. Pre-op nurse will coordinate an appropriate time for 1 support person to accompany patient in pre-op.  This support person may not rotate.    Please refer to the [facility] website for the visitor guidelines for Inpatients (after your surgery is over and you are in a regular room).     Your procedure is scheduled on: 01/04/23   Report to [facility] Main Entrance    Report to admitting at 9:00 AM   Call this number if you have problems the morning of surgery [phone]   Do not eat food :After Midnight.   After Midnight you may have the following liquids until 8:30  AM DAY OF SURGERY  Water Non-Citrus Juices (without pulp, NO RED-Apple, White grape, White cranberry) Black Coffee (NO MILK/CREAM OR CREAMERS, sugar ok)  Clear Tea (NO MILK/CREAM OR CREAMERS, sugar ok) regular and decaf                             Plain Jell-O (NO RED)                                           Fruit ices (not with fruit pulp, NO RED)                                     Popsicles (NO RED)                                                               Sports drinks like Gatorade (NO RED)                 The day of surgery:  Drink ONE (1) Pre-Surgery Clear Ensure or G2 at AM the morning of surgery. Drink in one sitting. Do not sip.  This drink was given to you during your hospital  pre-op appointment visit. Nothing else to drink after completing the  Pre-Surgery Clear Ensure  or G2.          If you have questions, please contact your surgeon's office.   FOLLOW BOWEL PREP AND ANY ADDITIONAL PRE OP INSTRUCTIONS YOU RECEIVED FROM YOUR SURGEON'S OFFICE!!!     Oral Hygiene is also important to reduce your risk of infection.                                    Remember - BRUSH YOUR TEETH THE MORNING OF SURGERY WITH YOUR  REGULAR TOOTHPASTE  DENTURES WILL BE REMOVED PRIOR TO SURGERY PLEASE DO NOT APPLY "Poly grip" OR ADHESIVES!!!   Do NOT smoke after Midnight   Take these medicines the morning of surgery with A SIP OF WATER: Amlodipine, Levothyroxine   DO NOT TAKE ANY ORAL DIABETIC MEDICATIONS DAY OF YOUR SURGERY  Bring CPAP mask and tubing day of surgery.                              You may not have any metal on your body including  jewelry, and body piercing             Do not wear lotions, powders, cologne, or deodorant  Do not shave  48 hours prior to surgery.               Men may shave face and neck.   Do not bring valuables to the hospital. [place].   Contacts, glasses, dentures or bridgework may not be worn into surgery.   Bring small overnight bag day of surgery.   DO NOT [place]. PHARMACY WILL DISPENSE MEDICATIONS LISTED ON YOUR MEDICATION LIST TO YOU DURING YOUR ADMISSION [place]!   Special Instructions: Bring a copy of your healthcare power of attorney and living will documents the day of surgery if you haven't scanned them before.              Please read over the following fact sheets you were given: IF [place] [phone] Schneiders    If you received a COVID test during your pre-op visit  it is requested that you wear a mask when out in public, stay away from anyone that may not be feeling well and notify your surgeon if you develop symptoms. If you test positive for Covid or have been in  contact with anyone that has tested positive in the last 10 days please notify you surgeon.    [facility] - Preparing for Surgery Before surgery, you can play an important role.  Because skin is not sterile, your skin needs to be as free of germs as possible.  You can reduce the number of germs on your skin by washing with CHG (chlorahexidine gluconate) soap before surgery.  CHG is an antiseptic cleaner which kills germs and bonds with the skin to continue killing germs even after washing. Please DO NOT use if you have an allergy to CHG or antibacterial soaps.  If your skin becomes reddened/irritated stop using the CHG and inform your nurse when you arrive at Short Stay. Do not shave (including legs and underarms) for at least 48 hours prior to the first CHG shower.  You may shave your face/neck.  Please follow these instructions carefully:  1.  Shower with CHG Soap the night before surgery and the  morning of surgery.  2.  If you choose to wash your hair, wash your hair first as usual with your normal  shampoo.  3.  After you shampoo, rinse your hair and body thoroughly to remove the shampoo.                             4.  Use CHG as you would any other  liquid soap.  You can apply chg directly to the skin and wash.  Gently with a scrungie or clean washcloth.  5.  Apply the CHG Soap to your body ONLY FROM THE NECK DOWN.   Do   not use on face/ open                           Wound or open sores. Avoid contact with eyes, ears mouth and   genitals (private parts).                       Wash face,  Genitals (private parts) with your normal soap.             6.  Wash thoroughly, paying special attention to the area where your    surgery  will be performed.  7.  Thoroughly rinse your body with warm water from the neck down.  8.  DO NOT shower/wash with your normal soap after using and rinsing off the CHG Soap.                9.  Pat yourself dry with a clean towel.            10.  Wear clean  pajamas.            11.  Place clean sheets on your bed the night of your first shower and do not  sleep with pets. Day of Surgery : Do not apply any lotions/deodorants the morning of surgery.  Please wear clean clothes to the hospital/surgery center.  FAILURE TO FOLLOW THESE INSTRUCTIONS MAY RESULT IN THE CANCELLATION OF YOUR SURGERY  PATIENT SIGNATURE_________________________________  NURSE SIGNATURE__________________________________  ________________________________________________________________________ WHAT IS A BLOOD TRANSFUSION? Blood Transfusion Information  A transfusion is the replacement of blood or some of its parts. Blood is made up of multiple cells which provide different functions. Red blood cells carry oxygen and are used for blood loss replacement. White blood cells fight against infection. Platelets control bleeding. Plasma helps clot blood. Other blood products are available for specialized needs, such as hemophilia or other clotting disorders. BEFORE THE TRANSFUSION  Who gives blood for transfusions?  Healthy volunteers who are fully evaluated to make sure their blood is safe. This is blood bank blood. Transfusion therapy is the safest it has ever been in the practice of medicine. Before blood is taken from a donor, a complete history is taken to make sure that person has no history of diseases nor engages in risky social behavior (examples are intravenous drug use or sexual activity with multiple partners). The donor's travel history is screened to minimize risk of transmitting infections, such as malaria. The donated blood is tested for signs of infectious diseases, such as HIV and hepatitis. The blood is then tested to be sure it is compatible with you in order to minimize the chance of a transfusion reaction. If you or a relative donates blood, this is often done in anticipation of surgery and is not appropriate for emergency situations. It takes many days to process  the donated blood. RISKS AND COMPLICATIONS Although transfusion therapy is very safe and saves many lives, the main dangers of transfusion include:  Getting an infectious disease. Developing a transfusion reaction. This is an allergic reaction to something in the blood you were given. Every precaution is taken to prevent this. The decision to have a blood transfusion has been considered carefully by your  caregiver before blood is given. Blood is not given unless the benefits outweigh the risks. AFTER THE TRANSFUSION Right after receiving a blood transfusion, you will usually feel much better and more energetic. This is especially true if your red blood cells have gotten low (anemic). The transfusion raises the level of the red blood cells which carry oxygen, and this usually causes an energy increase. The nurse administering the transfusion will monitor you carefully for complications. HOME CARE INSTRUCTIONS  No special instructions are needed after a transfusion. You may find your energy is better. Speak with your caregiver about any limitations on activity for underlying diseases you may have. SEEK MEDICAL CARE IF:  Your condition is not improving after your transfusion. You develop redness or irritation at the intravenous (IV) site. SEEK IMMEDIATE MEDICAL CARE IF:  Any of the following symptoms occur over the next 12 hours: Shaking chills. You have a temperature by mouth above 102 F (38.9 C), not controlled by medicine. Chest, back, or muscle pain. People around you feel you are not acting correctly or are confused. Shortness of breath or difficulty breathing. Dizziness and fainting. You get a rash or develop hives. You have a decrease in urine output. Your urine turns a dark color or changes to pink, red, or brown. Any of the following symptoms occur over the next 10 days: You have a temperature by mouth above 102 F (38.9 C), not controlled by medicine. Shortness of  breath. Weakness after normal activity. The white part of the eye turns yellow (jaundice). You have a decrease in the amount of urine or are urinating less often. Your urine turns a dark color or changes to pink, red, or brown. Document Released: 09/14/2000 Document Revised: 12/10/2011 Document Reviewed: 05/03/2008 [facility] Patient Information 2014 [place], [place].  _______________________________________________________________________

## 2022-12-22 ENCOUNTER — Other Ambulatory Visit: Payer: Self-pay | Admitting: Physician Assistant

## 2022-12-22 DIAGNOSIS — Z01818 Encounter for other preprocedural examination: Secondary | ICD-10-CM

## 2022-12-24 ENCOUNTER — Other Ambulatory Visit: Payer: Self-pay

## 2022-12-24 ENCOUNTER — Encounter (HOSPITAL_COMMUNITY): Payer: Self-pay

## 2022-12-24 ENCOUNTER — Encounter (HOSPITAL_COMMUNITY)
Admission: RE | Admit: 2022-12-24 | Discharge: 2022-12-24 | Disposition: A | Discharge status: Home / Self Care | Payer: BC Managed Care – PPO | Source: Ambulatory Visit | Attending: Orthopaedic Surgery | Admitting: Orthopaedic Surgery

## 2022-12-24 VITALS — BP 165/94 | HR 70 | Temp 98.4°F | Resp 16 | Ht 74.0 in | Wt 275.0 lb

## 2022-12-24 DIAGNOSIS — I10 Essential (primary) hypertension: Secondary | ICD-10-CM

## 2022-12-24 DIAGNOSIS — Z01818 Encounter for other preprocedural examination: Secondary | ICD-10-CM | POA: Diagnosis not present

## 2022-12-24 LAB — COMPREHENSIVE METABOLIC PANEL
ALT: 24 U/L (ref 0–44)
AST: 27 U/L (ref 15–41)
Albumin: 4.4 g/dL (ref 3.5–5.0)
Alkaline Phosphatase: 73 U/L (ref 38–126)
Anion gap: 9 (ref 5–15)
BUN: 12 mg/dL (ref 6–20)
CO2: 24 mmol/L (ref 22–32)
Calcium: 9.2 mg/dL (ref 8.9–10.3)
Chloride: 98 mmol/L (ref 98–111)
Creatinine, Ser: 0.9 mg/dL (ref 0.61–1.24)
GFR, Estimated: >60 mL/min (ref >60)
Glucose, Bld: 99 mg/dL (ref 70–99)
Potassium: 4.5 mmol/L (ref 3.5–5.1)
Sodium: 131 mmol/L — ABNORMAL LOW (ref 135–145)
Total Bilirubin: 0.9 mg/dL (ref 0.3–1.2)
Total Protein: 7.4 g/dL (ref 6.5–8.1)

## 2022-12-24 LAB — CBC
HCT: 42.9 % (ref 39.0–52.0)
Hemoglobin: 15.1 g/dL (ref 13.0–17.0)
MCH: 34.6 pg — ABNORMAL HIGH (ref 26.0–34.0)
MCHC: 35.2 g/dL (ref 30.0–36.0)
MCV: 98.2 fL (ref 80.0–100.0)
Platelets: 220 10*3/uL (ref 150–400)
RBC: 4.37 MIL/uL (ref 4.22–5.81)
RDW: 11.1 % — ABNORMAL LOW (ref 11.5–15.5)
WBC: 6.2 10*3/uL (ref 4.0–10.5)
nRBC: 0 % (ref 0.0–0.2)

## 2022-12-24 LAB — SURGICAL PCR SCREEN
MRSA, PCR: NEGATIVE
Staphylococcus aureus: NEGATIVE

## 2022-12-24 LAB — TYPE AND SCREEN
ABO/RH(D): AB POS
Antibody Screen: NEGATIVE

## 2023-01-03 DIAGNOSIS — M1611 Unilateral primary osteoarthritis, right hip: Secondary | ICD-10-CM | POA: Insufficient documentation

## 2023-01-03 NOTE — H&P (Signed)
TOTAL HIP ADMISSION H&P  Patient is admitted for right total hip arthroplasty.  Subjective:  Chief Complaint: right hip pain  HPI: Adam Crane, 57 y.o. male, has a history of pain and functional disability in the right hip(s) due to arthritis and patient has failed non-surgical conservative treatments for greater than 12 weeks to include NSAID's and/or analgesics and activity modification.  Onset of symptoms was gradual starting 1 years ago with gradually worsening course since that time.The patient noted no past surgery on the right hip(s).  Patient currently rates pain in the right hip at 10 out of 10 with activity. Patient has night pain, worsening of pain with activity and weight bearing, pain that interfers with activities of daily living, and pain with passive range of motion. Patient has evidence of subchondral sclerosis, periarticular osteophytes, and joint space narrowing by imaging studies. This condition presents safety issues increasing the risk of falls.  There is no current active infection.  Patient Active Problem List   Diagnosis Date Noted   Unilateral primary osteoarthritis, right hip 01/03/2023   Prostate cancer    Past Medical History:  Diagnosis Date   Hyperlipidemia    pt denies   Hypertension    on meds   Hypothyroidism    on meds   Prostate cancer ([place]) 11/04/2013   gleason 3+3=6, volume 35 cc   Thyroid disease    hypothyroid    Past Surgical History:  Procedure Laterality Date   COLONOSCOPY     DUPUYTREN CONTRACTURE RELEASE Left 2022   5th metacarpal   KNEE CARTILAGE SURGERY Bilateral 2018   meniscus repair bilateral   PROSTATE BIOPSY  11/04/2013   gleason 6, vol 35 cc   ROBOT ASSISTED LAPAROSCOPIC RADICAL PROSTATECTOMY N/A 09/06/2014   Procedure: ROBOTIC ASSISTED LAPAROSCOPIC RADICAL PROSTATECTOMY LEVEL 1;  Surgeon: Raynelle Bring, MD;  Location: [facility];  Service: Urology;  Laterality: N/A;   TONSILLECTOMY      No current facility-administered  medications for this encounter.   Current Outpatient Medications  Medication Sig Dispense Refill Last Dose   amLODipine (NORVASC) 10 MG tablet Take 10 mg by mouth daily.      aspirin 81 MG EC tablet Take 1 tablet by mouth daily at 6 (six) AM.      ibuprofen (ADVIL) 200 MG tablet Take 400-800 mg by mouth daily as needed for moderate pain.      levothyroxine (SYNTHROID, LEVOTHROID) 200 MCG tablet Take 200 mcg by mouth daily before breakfast. Takes with the 35mcg to = 287mcg every morning.      levothyroxine (SYNTHROID, LEVOTHROID) 25 MCG tablet Take 25 mcg by mouth daily before breakfast. Takes with the 200 mcg to = 260mcg every morning.      losartan (COZAAR) 100 MG tablet Take 100 mg by mouth daily.      Metoprolol Succinate 100 MG CS24 Take 100 mg by mouth daily.      Multiple Vitamin (MULTIVITAMIN) capsule Take 1 capsule by mouth daily.      sildenafil (VIAGRA) 50 MG tablet Take 50 mg by mouth as needed for erectile dysfunction.      vitamin E 1000 UNIT capsule Take 1 capsule by mouth daily at 6 (six) AM.      No Known Allergies  Social History   Tobacco Use   Smoking status: Never   Smokeless tobacco: Never  Substance Use Topics   Alcohol use: Yes    Alcohol/week: 10.0 standard drinks of alcohol    Types: 10  Standard drinks or equivalent per week    Family History  Problem Relation Age of Onset   Macular degeneration Mother    Cancer Father        prostate   Cancer Paternal Grandfather 70       breast, surgery   Colon polyps Neg Hx    Colon cancer Neg Hx    Esophageal cancer Neg Hx    Rectal cancer Neg Hx    Stomach cancer Neg Hx      Review of Systems  All other systems reviewed and are negative.   Objective:  Physical Exam Vitals reviewed.  Constitutional:      Appearance: Normal appearance. He is normal weight.  HENT:     Head: Normocephalic and atraumatic.  Eyes:     Extraocular Movements: Extraocular movements intact.     Pupils: Pupils are equal, round,  and reactive to light.  Cardiovascular:     Rate and Rhythm: Normal rate and regular rhythm.     Pulses: Normal pulses.  Pulmonary:     Effort: Pulmonary effort is normal.     Breath sounds: Normal breath sounds.  Abdominal:     Palpations: Abdomen is soft.  Musculoskeletal:     Cervical back: Normal range of motion and neck supple.     Right hip: Tenderness and bony tenderness present. Decreased range of motion. Decreased strength.  Neurological:     Mental Status: He is alert and oriented to person, place, and time.  Psychiatric:        Behavior: Behavior normal.     Vital signs in last 24 hours:    Labs:   Estimated body mass index is 35.31 kg/m as calculated from the following:   Height as of 12/24/22: 6\' 2"  (1.88 m).   Weight as of 12/24/22: 124.7 kg.   Imaging Review Plain radiographs demonstrate severe degenerative joint disease of the right hip(s). The bone quality appears to be excellent for age and reported activity level.      Assessment/Plan:  End stage arthritis, right hip(s)  The patient history, physical examination, clinical judgement of the provider and imaging studies are consistent with end stage degenerative joint disease of the right hip(s) and total hip arthroplasty is deemed medically necessary. The treatment options including medical management, injection therapy, arthroscopy and arthroplasty were discussed at length. The risks and benefits of total hip arthroplasty were presented and reviewed. The risks due to aseptic loosening, infection, stiffness, dislocation/subluxation,  thromboembolic complications and other imponderables were discussed.  The patient acknowledged the explanation, agreed to proceed with the plan and consent was signed. Patient is being admitted for inpatient treatment for surgery, pain control, PT, OT, prophylactic antibiotics, VTE prophylaxis, progressive ambulation and ADL's and discharge planning.The patient is planning to be  discharged home with home health services

## 2023-01-04 ENCOUNTER — Other Ambulatory Visit: Payer: Self-pay

## 2023-01-04 ENCOUNTER — Observation Stay (HOSPITAL_COMMUNITY)
Admission: RE | Admit: 2023-01-04 | Discharge: 2023-01-05 | Disposition: A | Discharge status: Home Health | Payer: BC Managed Care – PPO | Source: Ambulatory Visit | Attending: Orthopaedic Surgery | Admitting: Orthopaedic Surgery

## 2023-01-04 ENCOUNTER — Encounter (HOSPITAL_COMMUNITY)
Admission: RE | Disposition: A | Discharge status: Home Health | Payer: Self-pay | Source: Ambulatory Visit | Attending: Orthopaedic Surgery

## 2023-01-04 ENCOUNTER — Ambulatory Visit (HOSPITAL_COMMUNITY): Payer: BC Managed Care – PPO | Admitting: Physician Assistant

## 2023-01-04 ENCOUNTER — Observation Stay (HOSPITAL_COMMUNITY): Payer: BC Managed Care – PPO

## 2023-01-04 ENCOUNTER — Encounter (HOSPITAL_COMMUNITY): Payer: Self-pay | Admitting: Orthopaedic Surgery

## 2023-01-04 ENCOUNTER — Ambulatory Visit (HOSPITAL_COMMUNITY): Payer: BC Managed Care – PPO

## 2023-01-04 DIAGNOSIS — E039 Hypothyroidism, unspecified: Secondary | ICD-10-CM | POA: Insufficient documentation

## 2023-01-04 DIAGNOSIS — Z7982 Long term (current) use of aspirin: Secondary | ICD-10-CM | POA: Diagnosis not present

## 2023-01-04 DIAGNOSIS — M1611 Unilateral primary osteoarthritis, right hip: Principal | ICD-10-CM | POA: Insufficient documentation

## 2023-01-04 DIAGNOSIS — Z01818 Encounter for other preprocedural examination: Secondary | ICD-10-CM

## 2023-01-04 DIAGNOSIS — Z96641 Presence of right artificial hip joint: Secondary | ICD-10-CM | POA: Diagnosis not present

## 2023-01-04 DIAGNOSIS — Z471 Aftercare following joint replacement surgery: Secondary | ICD-10-CM | POA: Diagnosis not present

## 2023-01-04 DIAGNOSIS — Z79899 Other long term (current) drug therapy: Secondary | ICD-10-CM | POA: Diagnosis not present

## 2023-01-04 DIAGNOSIS — I10 Essential (primary) hypertension: Secondary | ICD-10-CM | POA: Insufficient documentation

## 2023-01-04 DIAGNOSIS — Z8546 Personal history of malignant neoplasm of prostate: Secondary | ICD-10-CM | POA: Diagnosis not present

## 2023-01-04 DIAGNOSIS — M6281 Muscle weakness (generalized): Secondary | ICD-10-CM | POA: Diagnosis not present

## 2023-01-04 HISTORY — PX: TOTAL HIP ARTHROPLASTY: SHX124

## 2023-01-04 LAB — BASIC METABOLIC PANEL
Anion gap: 10 (ref 5–15)
BUN: 10 mg/dL (ref 6–20)
CO2: 23 mmol/L (ref 22–32)
Calcium: 8.7 mg/dL — ABNORMAL LOW (ref 8.9–10.3)
Chloride: 101 mmol/L (ref 98–111)
Creatinine, Ser: 0.96 mg/dL (ref 0.61–1.24)
GFR, Estimated: >60 mL/min (ref >60)
Glucose, Bld: 120 mg/dL — ABNORMAL HIGH (ref 70–99)
Potassium: 4.1 mmol/L (ref 3.5–5.1)
Sodium: 134 mmol/L — ABNORMAL LOW (ref 135–145)

## 2023-01-04 LAB — CBC
HCT: 45.2 % (ref 39.0–52.0)
Hemoglobin: 15.1 g/dL (ref 13.0–17.0)
MCH: 34.1 pg — ABNORMAL HIGH (ref 26.0–34.0)
MCHC: 33.4 g/dL (ref 30.0–36.0)
MCV: 102 fL — ABNORMAL HIGH (ref 80.0–100.0)
Platelets: 216 10*3/uL (ref 150–400)
RBC: 4.43 MIL/uL (ref 4.22–5.81)
RDW: 11.1 % — ABNORMAL LOW (ref 11.5–15.5)
WBC: 9.9 10*3/uL (ref 4.0–10.5)
nRBC: 0 % (ref 0.0–0.2)

## 2023-01-04 LAB — ABO/RH: ABO/RH(D): AB POS

## 2023-01-04 SURGERY — ARTHROPLASTY, HIP, TOTAL, ANTERIOR APPROACH
Anesthesia: Spinal | Site: Hip | Laterality: Right

## 2023-01-04 MED ORDER — ONDANSETRON HCL 4 MG/2ML IJ SOLN: 4.0000 mg | Freq: Once | INTRAMUSCULAR | Status: DC | PRN

## 2023-01-04 MED ORDER — METOPROLOL SUCCINATE ER 100 MG PO TB24
100.0000 mg | ORAL_TABLET | Freq: Every day | ORAL | Status: DC
  Administered 2023-01-05: 100 mg via ORAL
  Filled 2023-01-04: qty 1

## 2023-01-04 MED ORDER — SODIUM CHLORIDE 0.9 % IR SOLN
Status: DC | PRN
  Administered 2023-01-04: 1000 mL

## 2023-01-04 MED ORDER — LOSARTAN POTASSIUM 50 MG PO TABS
100.0000 mg | ORAL_TABLET | Freq: Every day | ORAL | Status: DC
  Administered 2023-01-04 – 2023-01-05 (×2): 100 mg via ORAL
  Filled 2023-01-04 (×2): qty 2

## 2023-01-04 MED ORDER — ACETAMINOPHEN 500 MG PO TABS: 1000.0000 mg | ORAL_TABLET | Freq: Once | ORAL | Status: AC

## 2023-01-04 MED ORDER — SODIUM CHLORIDE 0.9 % IV SOLN: INTRAVENOUS | Status: DC

## 2023-01-04 MED ORDER — ASPIRIN 81 MG PO CHEW
81.0000 mg | CHEWABLE_TABLET | Freq: Two times a day (BID) | ORAL | Status: DC
  Administered 2023-01-04 – 2023-01-05 (×2): 81 mg via ORAL
  Filled 2023-01-04 (×2): qty 1

## 2023-01-04 MED ORDER — LACTATED RINGERS IV SOLN: INTRAVENOUS | Status: DC

## 2023-01-04 MED ORDER — HYDROMORPHONE HCL 1 MG/ML IJ SOLN: 0.5000 mg | INTRAMUSCULAR | Status: DC | PRN

## 2023-01-04 MED ORDER — METOCLOPRAMIDE HCL 5 MG/ML IJ SOLN: 5.0000 mg | Freq: Three times a day (TID) | INTRAMUSCULAR | Status: DC | PRN

## 2023-01-04 MED ORDER — ALUM & MAG HYDROXIDE-SIMETH 200-200-20 MG/5ML PO SUSP: 30.0000 mL | ORAL | Status: DC | PRN

## 2023-01-04 MED ORDER — TRANEXAMIC ACID-NACL 1000-0.7 MG/100ML-% IV SOLN
1000.0000 mg | INTRAVENOUS | Status: AC
  Administered 2023-01-04: 1000 mg via INTRAVENOUS
  Filled 2023-01-04: qty 100

## 2023-01-04 MED ORDER — ONDANSETRON HCL 4 MG PO TABS: 4.0000 mg | ORAL_TABLET | Freq: Four times a day (QID) | ORAL | Status: DC | PRN

## 2023-01-04 MED ORDER — VITAMIN E 45 MG (100 UNIT) PO CAPS
1000.0000 [IU] | ORAL_CAPSULE | Freq: Every day | ORAL | Status: DC
  Administered 2023-01-05: 1000 [IU] via ORAL
  Filled 2023-01-04: qty 10

## 2023-01-04 MED ORDER — 0.9 % SODIUM CHLORIDE (POUR BTL) OPTIME
TOPICAL | Status: DC | PRN
  Administered 2023-01-04: 1000 mL

## 2023-01-04 MED ORDER — CEFAZOLIN IN SODIUM CHLORIDE 3-0.9 GM/100ML-% IV SOLN
3.0000 g | INTRAVENOUS | Status: AC
  Administered 2023-01-04: 2 g via INTRAVENOUS
  Filled 2023-01-04: qty 100

## 2023-01-04 MED ORDER — OXYCODONE HCL 5 MG PO TABS
10.0000 mg | ORAL_TABLET | ORAL | Status: DC | PRN
  Administered 2023-01-05: 10 mg via ORAL

## 2023-01-04 MED ORDER — ACETAMINOPHEN 500 MG PO TABS
ORAL_TABLET | ORAL | Status: AC
  Administered 2023-01-04: 1000 mg via ORAL
  Filled 2023-01-04: qty 2

## 2023-01-04 MED ORDER — METHOCARBAMOL 500 MG PO TABS: 500.0000 mg | ORAL_TABLET | Freq: Four times a day (QID) | ORAL | Status: DC | PRN

## 2023-01-04 MED ORDER — DIPHENHYDRAMINE HCL 12.5 MG/5ML PO ELIX: 12.5000 mg | ORAL_SOLUTION | ORAL | Status: DC | PRN

## 2023-01-04 MED ORDER — HYDROMORPHONE HCL 1 MG/ML IJ SOLN: 0.2500 mg | INTRAMUSCULAR | Status: DC | PRN

## 2023-01-04 MED ORDER — MULTIVITAMINS PO CAPS: 1.0000 | ORAL_CAPSULE | Freq: Every day | ORAL | Status: DC

## 2023-01-04 MED ORDER — CHLORHEXIDINE GLUCONATE 0.12 % MT SOLN
15.0000 mL | Freq: Once | OROMUCOSAL | Status: AC
  Administered 2023-01-04: 15 mL via OROMUCOSAL

## 2023-01-04 MED ORDER — ORAL CARE MOUTH RINSE: 15.0000 mL | Freq: Once | OROMUCOSAL | Status: AC

## 2023-01-04 MED ORDER — MENTHOL 3 MG MT LOZG: 1.0000 | LOZENGE | OROMUCOSAL | Status: DC | PRN

## 2023-01-04 MED ORDER — BUPIVACAINE HCL (PF) 0.75 % IJ SOLN
INTRAMUSCULAR | Status: DC | PRN
  Administered 2023-01-04: 1.8 mL via INTRATHECAL

## 2023-01-04 MED ORDER — CEFAZOLIN SODIUM-DEXTROSE 1-4 GM/50ML-% IV SOLN
1.0000 g | Freq: Four times a day (QID) | INTRAVENOUS | Status: AC
  Administered 2023-01-04 (×2): 1 g via INTRAVENOUS
  Filled 2023-01-04 (×2): qty 50

## 2023-01-04 MED ORDER — AMLODIPINE BESYLATE 10 MG PO TABS
10.0000 mg | ORAL_TABLET | Freq: Every day | ORAL | Status: DC
  Administered 2023-01-05: 10 mg via ORAL
  Filled 2023-01-04: qty 1

## 2023-01-04 MED ORDER — LEVOTHYROXINE SODIUM 25 MCG PO TABS
25.0000 ug | ORAL_TABLET | Freq: Every day | ORAL | Status: DC
  Administered 2023-01-05: 25 ug via ORAL
  Filled 2023-01-04: qty 1

## 2023-01-04 MED ORDER — OXYCODONE HCL 5 MG PO TABS: 5.0000 mg | ORAL_TABLET | Freq: Once | ORAL | Status: DC | PRN

## 2023-01-04 MED ORDER — ADULT MULTIVITAMIN W/MINERALS CH
1.0000 | ORAL_TABLET | Freq: Every day | ORAL | Status: DC
  Administered 2023-01-05: 1 via ORAL
  Filled 2023-01-04: qty 1

## 2023-01-04 MED ORDER — FENTANYL CITRATE (PF) 100 MCG/2ML IJ SOLN
INTRAMUSCULAR | Status: DC | PRN
  Administered 2023-01-04: 100 ug via INTRAVENOUS

## 2023-01-04 MED ORDER — PROPOFOL 500 MG/50ML IV EMUL
INTRAVENOUS | Status: DC | PRN
  Administered 2023-01-04: 75 ug/kg/min via INTRAVENOUS

## 2023-01-04 MED ORDER — OXYCODONE HCL 5 MG/5ML PO SOLN: 5.0000 mg | Freq: Once | ORAL | Status: DC | PRN

## 2023-01-04 MED ORDER — POVIDONE-IODINE 10 % EX SWAB: 2.0000 | Freq: Once | CUTANEOUS | Status: DC

## 2023-01-04 MED ORDER — LEVOTHYROXINE SODIUM 25 MCG PO TABS: 25.0000 ug | ORAL_TABLET | Freq: Every day | ORAL | Status: DC

## 2023-01-04 MED ORDER — MIDAZOLAM HCL 2 MG/2ML IJ SOLN
INTRAMUSCULAR | Status: AC
  Filled 2023-01-04: qty 2

## 2023-01-04 MED ORDER — DOCUSATE SODIUM 100 MG PO CAPS
100.0000 mg | ORAL_CAPSULE | Freq: Two times a day (BID) | ORAL | Status: DC
  Administered 2023-01-04 – 2023-01-05 (×2): 100 mg via ORAL
  Filled 2023-01-04 (×2): qty 1

## 2023-01-04 MED ORDER — ONDANSETRON HCL 4 MG/2ML IJ SOLN: 4.0000 mg | Freq: Four times a day (QID) | INTRAMUSCULAR | Status: DC | PRN

## 2023-01-04 MED ORDER — METOCLOPRAMIDE HCL 5 MG PO TABS: 5.0000 mg | ORAL_TABLET | Freq: Three times a day (TID) | ORAL | Status: DC | PRN

## 2023-01-04 MED ORDER — PHENOL 1.4 % MT LIQD: 1.0000 | OROMUCOSAL | Status: DC | PRN

## 2023-01-04 MED ORDER — PANTOPRAZOLE SODIUM 40 MG PO TBEC
40.0000 mg | DELAYED_RELEASE_TABLET | Freq: Every day | ORAL | Status: DC
  Administered 2023-01-04 – 2023-01-05 (×2): 40 mg via ORAL
  Filled 2023-01-04 (×2): qty 1

## 2023-01-04 MED ORDER — EPHEDRINE SULFATE (PRESSORS) 50 MG/ML IJ SOLN
INTRAMUSCULAR | Status: DC | PRN
  Administered 2023-01-04: 5 mg via INTRAVENOUS
  Administered 2023-01-04 (×2): 10 mg via INTRAVENOUS

## 2023-01-04 MED ORDER — LEVOTHYROXINE SODIUM 100 MCG PO TABS
200.0000 ug | ORAL_TABLET | Freq: Every day | ORAL | Status: DC
  Administered 2023-01-05: 200 ug via ORAL
  Filled 2023-01-04: qty 2

## 2023-01-04 MED ORDER — MIDAZOLAM HCL 5 MG/5ML IJ SOLN
INTRAMUSCULAR | Status: DC | PRN
  Administered 2023-01-04: 2 mg via INTRAVENOUS

## 2023-01-04 MED ORDER — LEVOTHYROXINE SODIUM 100 MCG PO TABS: 200.0000 ug | ORAL_TABLET | Freq: Every day | ORAL | Status: DC

## 2023-01-04 MED ORDER — METHOCARBAMOL 500 MG IVPB - SIMPLE MED: 500.0000 mg | Freq: Four times a day (QID) | INTRAVENOUS | Status: DC | PRN

## 2023-01-04 MED ORDER — OXYCODONE HCL 5 MG PO TABS
5.0000 mg | ORAL_TABLET | ORAL | Status: DC | PRN
  Administered 2023-01-04 – 2023-01-05 (×4): 10 mg via ORAL
  Filled 2023-01-04 (×5): qty 2

## 2023-01-04 MED ORDER — ONDANSETRON HCL 4 MG/2ML IJ SOLN
INTRAMUSCULAR | Status: DC | PRN
  Administered 2023-01-04: 4 mg via INTRAVENOUS

## 2023-01-04 MED ORDER — DEXAMETHASONE SODIUM PHOSPHATE 10 MG/ML IJ SOLN
INTRAMUSCULAR | Status: DC | PRN
  Administered 2023-01-04: 10 mg via INTRAVENOUS

## 2023-01-04 MED ORDER — ACETAMINOPHEN 325 MG PO TABS: 325.0000 mg | ORAL_TABLET | Freq: Four times a day (QID) | ORAL | Status: DC | PRN

## 2023-01-04 MED ORDER — FENTANYL CITRATE (PF) 100 MCG/2ML IJ SOLN
INTRAMUSCULAR | Status: AC
  Filled 2023-01-04: qty 2

## 2023-01-04 SURGICAL SUPPLY — 42 items
APL SKNCLS STERI-STRIP NONHPOA (GAUZE/BANDAGES/DRESSINGS)
BAG COUNTER SPONGE SURGICOUNT (BAG) ×1 IMPLANT
BAG SPEC THK2 15X12 ZIP CLS (MISCELLANEOUS) ×1
BAG SPNG CNTER NS LX DISP (BAG) ×1
BAG ZIPLOCK 12X15 (MISCELLANEOUS) IMPLANT
BENZOIN TINCTURE PRP APPL 2/3 (GAUZE/BANDAGES/DRESSINGS) IMPLANT
BLADE SAW SGTL 18X1.27X75 (BLADE) ×1 IMPLANT
COVER PERINEAL POST (MISCELLANEOUS) ×1 IMPLANT
COVER SURGICAL LIGHT HANDLE (MISCELLANEOUS) ×1 IMPLANT
CUP ACET PNNCL SECTR W/GRIP 56 (Hips) IMPLANT
DRAPE FOOT SWITCH (DRAPES) ×1 IMPLANT
DRAPE STERI IOBAN 125X83 (DRAPES) ×1 IMPLANT
DRAPE U-SHAPE 47X51 STRL (DRAPES) ×2 IMPLANT
DRSG AQUACEL AG ADV 3.5X10 (GAUZE/BANDAGES/DRESSINGS) ×1 IMPLANT
DURAPREP 26ML APPLICATOR (WOUND CARE) ×1 IMPLANT
ELECT REM PT RETURN 15FT ADLT (MISCELLANEOUS) ×1 IMPLANT
GAUZE XEROFORM 1X8 LF (GAUZE/BANDAGES/DRESSINGS) ×1 IMPLANT
GLOVE BIO SURGEON STRL SZ7.5 (GLOVE) ×1 IMPLANT
GLOVE BIOGEL PI IND STRL 8 (GLOVE) ×2 IMPLANT
GLOVE ECLIPSE 8.0 STRL XLNG CF (GLOVE) ×1 IMPLANT
GOWN STRL REUS W/ TWL XL LVL3 (GOWN DISPOSABLE) ×2 IMPLANT
GOWN STRL REUS W/TWL XL LVL3 (GOWN DISPOSABLE) ×2
HANDPIECE INTERPULSE COAX TIP (DISPOSABLE) ×1
HEAD CERAMIC 36 PLUS5 (Hips) IMPLANT
HOLDER FOLEY CATH W/STRAP (MISCELLANEOUS) ×1 IMPLANT
KIT TURNOVER KIT A (KITS) IMPLANT
PACK ANTERIOR HIP CUSTOM (KITS) ×1 IMPLANT
PINN SECTOR W/GRIP ACE CUP 56 (Hips) ×1 IMPLANT
PINNACLE ALTRX PLUS 4 N 36X56 (Hips) IMPLANT
SET HNDPC FAN SPRY TIP SCT (DISPOSABLE) ×1 IMPLANT
STAPLER VISISTAT 35W (STAPLE) IMPLANT
STEM FEM ACTIS HIGH SZ7 (Stem) IMPLANT
STRIP CLOSURE SKIN 1/2X4 (GAUZE/BANDAGES/DRESSINGS) IMPLANT
SUT ETHIBOND NAB CT1 #1 30IN (SUTURE) ×1 IMPLANT
SUT ETHILON 2 0 PS N (SUTURE) IMPLANT
SUT MNCRL AB 4-0 PS2 18 (SUTURE) IMPLANT
SUT VIC AB 0 CT1 36 (SUTURE) ×1 IMPLANT
SUT VIC AB 1 CT1 36 (SUTURE) ×1 IMPLANT
SUT VIC AB 2-0 CT1 27 (SUTURE) ×2
SUT VIC AB 2-0 CT1 TAPERPNT 27 (SUTURE) ×2 IMPLANT
TRAY FOLEY MTR SLVR 16FR STAT (SET/KITS/TRAYS/PACK) IMPLANT
YANKAUER SUCT BULB TIP NO VENT (SUCTIONS) ×1 IMPLANT

## 2023-01-04 NOTE — Interval H&P Note (Signed)
History and Physical Interval Note: The patient understands that he is here today for right hip replacement to treat his severe right hip arthritis.  There is been no acute or interval change in his medical status.  See H&P.  The risks and benefits of surgery been discussed in detail and informed consent was obtained.  The right operative hip has been marked.  01/04/2023 10:00 AM  Adam Crane  has presented today for surgery, with the diagnosis of osteoarthritis right hip.  The various methods of treatment have been discussed with the patient and family. After consideration of risks, benefits and other options for treatment, the patient has consented to  Procedure(s): RIGHT TOTAL HIP ARTHROPLASTY ANTERIOR APPROACH (Right) as a surgical intervention.  The patient's history has been reviewed, patient examined, no change in status, stable for surgery.  I have reviewed the patient's chart and labs.  Questions were answered to the patient's satisfaction.     Kathryne Hitch

## 2023-01-04 NOTE — Op Note (Signed)
Operative Notes  Date of operation: 01/04/2023 Preoperative diagnosis: Right hip primary osteoarthritis Postoperative diagnosis: Same  Procedure: Right direct anterior total hip arthroplasty  Implants: Implant Name Type Inv. Item Serial No. Manufacturer Lot No. LRB No. Used Action  PINN SECTOR W/GRIP ACE CUP 56 - [id number] Hips PINN SECTOR W/GRIP ACE CUP 56  [employer] [id number] Right 1 Implanted  PINNACLE ALTRX PLUS 4 N 36X56 - [id number] Hips PINNACLE ALTRX PLUS 4 N 36X56  [employer] [id number] Right 1 Implanted  STEM FEM ACTIS HIGH SZ7 - [id number] Stem STEM FEM ACTIS HIGH SZ7  [employer] [id number] Right 1 Implanted  HEAD CERAMIC 36 PLUS5 - [id number] Hips HEAD CERAMIC 36 PLUS5  [employer] [id number] Right 1 Implanted   Surgeon: Vanita Panda. Magnus Ivan, MD Assistant: Rexene Edison, PA-C  Anesthesia: Spinal EBL: 250 cc Antibiotics: IV Ancef Complications: None  Indications: The patient is a 57 year old gentleman with well-documented debilitating arthritis involving his right hip this been seen on clinical exam and x-ray findings.  He has tried and failed conservative treatment and is at the point where his right hip pain is daily and it is detrimentally affecting his mobility, his quality life, and his actives daily living.  He does wish to proceed with a hip replacement and we agree as well.  He understands the risk of acute blood loss anemia, nerve or vessel injury, fracture, infection, DVT, dislocation, implant failure, leg length images and wound healing issues.  He understands that her goals are hopefully decrease pain, improve mobility, and improve quality of life.  Procedure description: After informed consent was obtained and the appropriate right hip was marked, the patient was brought to the operating room and set up on the stretcher where spinal anesthesia was obtained.  He was then laid in supine position on stretcher and a Foley catheter was placed.   Traction boots were placed on both his feet.  He is significantly shorter on his right operative side than the left.  He was then placed supine on the Hana fracture table with both legs and inline skeletal traction devices but no traction applied.  His right operative hip and pelvis were assessed radiographically and then the right hip was prepped and draped with DuraPrep and sterile drapes.  A timeout was called and he was identified as correct patient the correct right hip.  An incision was then made just inferior and posterior to the ASIS and carried slightly obliquely down the leg.  Dissection was carried down the tensor fascia lata muscle and the tensor fascia was then divided longitudinally to proceed with a direct and approach the hip.  Circumflex vessels were identified cauterized and then the hip capsule was identified opened up in L-type format finding a moderate joint effusion.  Cobra retractors were then placed around the medial and lateral femoral neck and a femoral neck cut was made with an oscillating saw just proximal to the lesser trochanter and completed with an osteotome.  A corkscrew guide was placed in the femoral head and the femoral head was removed in its entirety and there was a wide area devoid of cartilage.  A bent Hohmann was then placed over the medial acetabular rim and remnants of the acetabular labrum and other debris removed.  Reaming was then initiated under direct visualization from a size 43 reamer going all the way up to a size 55 and stabilized increments.  The last reamer was also placed under direct fluoroscopy in order to obtain the depth of  reaming, the inclination and anteversion.  We then replaced the real DePuy sector GRIPTION acetabular component size 56 without difficulty and a 36+4 polyethylene liner.  Attention was then turned to the femur.  With the right leg externally rotated to 120 degrees, extended and adducted a Mueller retractor is placed medially and a Hohmann  retractor was placed behind the greater trochanter.  A box cutting osteotome was used into the femoral canal and the lateral joint capsule was lateralized.  Broaching was then initiated from a size 0 broach and stepwise increments going up to a size 7.  We trialed a high offset femoral neck offset size 7 broach and a 36+1.5 trial hip ball.  The leg was brought over and up and with traction and internal rotation reduced in the pelvis.  We did need more leg length.  The offset was good we are pleased with range of motion and stability as well.  We then dislocated the hip and remove the trial components.  We placed the real Actis femoral component with high offset size 7 and the real 36+5 ceramic hip bone again reduces in acetabulum and we are pleased with stability and range of motion assessed clinically and radiographically.  The tissue was then irrigated with normal saline solution.  The joint capsule was closed with interrupted #1 Ethibond suture followed by #1 Vicryl to close the tensor fascia.  0 Vicryl was used to close deep tissue and 2-0 Vicryl was used to close the subcutaneous tissue.  The skin was closed with staples.  An Aquacel dressing was applied.  The patient was taken recovery room in stable addition.  Rexene Edison, PA-C did assist during the entire case and beginning the end and his assistance was crucial medically necessary for soft tissue retraction and management, helping guide implant placement and a layered closure of the wound.

## 2023-01-04 NOTE — TOC Transition Note (Signed)
Transition of Care [facility]) - CM/SW Discharge Note   Patient Details  Name: KEESHAUN WIGGINGTON MRN: [id number] Date of Birth: Feb 15, 1966  [facility] CM/SW Contact:  Amada Jupiter, LCSW Phone Number: 01/04/2023, 2:53 PM   Clinical Narrative:    Met with pt and wife and confirming need for RW - no DME agency preference - order placed with [employer] [facility] for delivery to room.  HHPT prearranged with [facility] and info placed on AVS.  No further TOC needs.   Final next level of care: Home w Home Health Services Barriers to Discharge: No Barriers Identified   Patient Goals and CMS Choice      Discharge Placement                         Discharge Plan and Services Additional resources added to the After Visit Summary for                  DME Arranged: Walker rolling DME Agency: [employer] Date DME Agency Contacted: 01/04/23 Time DME Agency Contacted: 1453 Representative spoke with at DME Agency: Belenda Cruise HH Arranged: PT HH Agency: [facility]        Social Determinants of Health (SDOH) Interventions SDOH Screenings   Tobacco Use: Low Risk  (01/04/2023)     Readmission Risk Interventions     No data to display

## 2023-01-04 NOTE — Evaluation (Signed)
Physical Therapy Evaluation Patient Details Name: Adam LuzWilliam P Spiegelman MRN: [id number] DOB: 03/13/1966 Today's Date: 01/04/2023  History of Present Illness  57 yo male presents to therapy s/p R THA, anterior approach on 01/04/2023 due to failure of conservative measures. Pt PMH includes but is not limited to: HDL, HTN, prostate ca, and hypothyroidism.  Clinical Impression  Adam Crane is a 57 y.o. male POD 0 s/p R THA, AA. Patient reports IND with mobility at baseline. Patient is now limited by functional impairments (see PT problem list below) and requires S for bed mobility and min guard and cues for transfers. Patient was able to ambulate 140 feet with RW and min guard level of assist. Patient instructed in exercise to facilitate ROM and circulation to manage edema.  Patient will benefit from continued skilled PT interventions to address impairments and progress towards PLOF. Acute PT will follow to progress mobility and stair training in preparation for safe discharge home with family support and pt and spouse report HH services.        Recommendations for follow up therapy are one component of a multi-disciplinary discharge planning process, led by the attending physician.  Recommendations may be updated based on patient status, additional functional criteria and insurance authorization.  Follow Up Recommendations       Assistance Recommended at Discharge Intermittent Supervision/Assistance  Patient can return home with the following  A little help with walking and/or transfers;A little help with bathing/dressing/bathroom;Assistance with cooking/housework;Assist for transportation;Help with stairs or ramp for entrance    Equipment Recommendations Rolling walker (2 wheels) (provided at eval and adjusted)  Recommendations for Other Services       Functional Status Assessment Patient has had a recent decline in their functional status and demonstrates the ability to make significant  improvements in function in a reasonable and predictable amount of time.     Precautions / Restrictions Precautions Precautions: Fall Restrictions Weight Bearing Restrictions: No      Mobility  Bed Mobility Overal bed mobility: Needs Assistance Bed Mobility: Supine to Sit     Supine to sit: Supervision, HOB elevated          Transfers Overall transfer level: Needs assistance Equipment used: Rolling walker (2 wheels) Transfers: Sit to/from Stand Sit to Stand: Min guard           General transfer comment: cues for proper UE and AD placement    Ambulation/Gait Ambulation/Gait assistance: Min guard Gait Distance (Feet): 140 Feet Assistive device: Rolling walker (2 wheels) Gait Pattern/deviations: Step-to pattern Gait velocity: decreased     General Gait Details: pt able to progress from step to pattern to step through pattern and Min guard to close S with prolonged gait  Stairs            Wheelchair Mobility    Modified Rankin (Stroke Patients Only)       Balance Overall balance assessment: Needs assistance Sitting-balance support: Feet supported Sitting balance-Leahy Scale: Good     Standing balance support: Bilateral upper extremity supported, During functional activity Standing balance-Leahy Scale: Poor                               Pertinent Vitals/Pain Pain Assessment Pain Assessment: 0-10 Pain Score: 1  Pain Location: R hip Pain Descriptors / Indicators: Constant Pain Intervention(s): Limited activity within patient's tolerance, Monitored during session, Premedicated before session, Repositioned, Ice applied    Home Living Family/patient expects  to be discharged to:: Private residence Living Arrangements: Spouse/significant other;Children Available Help at Discharge: Family Type of Home: House Home Access: Stairs to enter Entrance Stairs-Rails: None Entrance Stairs-Number of Steps: 2 Alternate Level Stairs-Number of  Steps: 13 Home Layout: Two level;Able to live on main level with bedroom/bathroom Home Equipment: BSC/3in1;Crutches Additional Comments: long handled shoe horn    Prior Function Prior Level of Function : Independent/Modified Independent;Driving;Working/employed             Mobility Comments: IND with all ADLs, self care tasks, IADLs, driving working       Higher education careers adviser        Extremity/Trunk Assessment        Lower Extremity Assessment Lower Extremity Assessment: RLE deficits/detail RLE Deficits / Details: ankle DF/PF 5/5 RLE Sensation: WNL    Cervical / Trunk Assessment Cervical / Trunk Assessment: Normal  Communication   Communication: No difficulties  Cognition Arousal/Alertness: Awake/alert Behavior During Therapy: WFL for tasks assessed/performed Overall Cognitive Status: Within Functional Limits for tasks assessed                                          General Comments      Exercises Total Joint Exercises Ankle Circles/Pumps: AROM, 20 reps, Both   Assessment/Plan    PT Assessment Patient needs continued PT services  PT Problem List Decreased strength;Decreased range of motion;Decreased activity tolerance;Decreased balance;Decreased mobility;Decreased coordination;Decreased knowledge of use of DME       PT Treatment Interventions DME instruction;Gait training;Stair training;Functional mobility training;Therapeutic activities;Therapeutic exercise;Balance training;Neuromuscular re-education;Patient/family education;Modalities    PT Goals (Current goals can be found in the Care Plan section)  Acute Rehab PT Goals Patient Stated Goal: to be able to get out and walk again PT Goal Formulation: With patient/family Time For Goal Achievement: 01/18/23 Potential to Achieve Goals: Good    Frequency 7X/week     Co-evaluation               AM-PAC PT "6 Clicks" Mobility  Outcome Measure Help needed turning from your back to your  side while in a flat bed without using bedrails?: None Help needed moving from lying on your back to sitting on the side of a flat bed without using bedrails?: None Help needed moving to and from a bed to a chair (including a wheelchair)?: A Little Help needed standing up from a chair using your arms (e.g., wheelchair or bedside chair)?: A Little Help needed to walk in hospital room?: A Little Help needed climbing 3-5 steps with a railing? : A Lot 6 Click Score: 19    End of Session Equipment Utilized During Treatment: Gait belt Activity Tolerance: No increased pain;Patient tolerated treatment well Patient left: in chair;with call bell/phone within reach;with family/visitor present Nurse Communication: Mobility status PT Visit Diagnosis: Unsteadiness on feet (R26.81);Other abnormalities of gait and mobility (R26.89);Muscle weakness (generalized) (M62.81)    Time: 5597-4163 PT Time Calculation (min) (ACUTE ONLY): 29 min   Charges:   PT Evaluation $PT Eval Low Complexity: 1 Low PT Treatments $Gait Training: 8-22 mins        Rica Mote, PT   Jacqualyn Posey 01/04/2023, 5:37 PM

## 2023-01-04 NOTE — Anesthesia Postprocedure Evaluation (Signed)
Anesthesia Post Note  Patient: Adam Crane  Procedure(s) Performed: RIGHT TOTAL HIP ARTHROPLASTY ANTERIOR APPROACH (Right: Hip)     Patient location during evaluation: PACU Anesthesia Type: Spinal Level of consciousness: oriented and awake and alert Pain management: pain level controlled Vital Signs Assessment: post-procedure vital signs reviewed and stable Respiratory status: spontaneous breathing, respiratory function stable and nonlabored ventilation Cardiovascular status: blood pressure returned to baseline and stable Postop Assessment: no headache, no backache, no apparent nausea or vomiting, patient able to bend at knees and spinal receding Anesthetic complications: no   No notable events documented.  Last Vitals:  Vitals:   01/04/23 1400 01/04/23 1415  BP: (!) 144/88 (!) 143/97  Pulse: (!) 56 60  Resp: 13 19  Temp:  36.4 C  SpO2: 95% 95%    Last Pain:  Vitals:   01/04/23 1400  TempSrc:   PainSc: 0-No pain                 Hamsini Verrilli A.

## 2023-01-04 NOTE — Transfer of Care (Signed)
Immediate Anesthesia Transfer of Care Note  Patient: Adam Crane  Procedure(s) Performed: RIGHT TOTAL HIP ARTHROPLASTY ANTERIOR APPROACH (Right: Hip)  Patient Location: PACU  Anesthesia Type:Spinal  Level of Consciousness: awake, alert , and oriented  Airway & Oxygen Therapy: Patient Spontanous Breathing and Patient connected to face mask oxygen  Post-op Assessment: Report given to RN and Post -op Vital signs reviewed and stable  Post vital signs: Reviewed and stable  Last Vitals:  Vitals Value Taken Time  BP 113/73 01/04/23 1303  Temp    Pulse 62 01/04/23 1305  Resp 13 01/04/23 1305  SpO2 94 % 01/04/23 1305  Vitals shown include unvalidated device data.  Last Pain:  Vitals:   01/04/23 0931  TempSrc:   PainSc: 0-No pain         Complications: No notable events documented.

## 2023-01-04 NOTE — Anesthesia Preprocedure Evaluation (Addendum)
Anesthesia Evaluation  Patient identified by MRN, date of birth, ID band Patient awake    Reviewed: Allergy & Precautions, NPO status , Patient's Chart, lab work & pertinent test results, reviewed documented beta blocker date and time   Airway Mallampati: II  TM Distance: >3 FB Neck ROM: Full    Dental no notable dental hx. (+) Teeth Intact, Dental Advisory Given, Caps,    Pulmonary neg pulmonary ROS   Pulmonary exam normal breath sounds clear to auscultation       Cardiovascular hypertension, Pt. on medications and Pt. on home beta blockers Normal cardiovascular exam Rhythm:Regular Rate:Normal     Neuro/Psych negative neurological ROS  negative psych ROS   GI/Hepatic negative GI ROS, Neg liver ROS,,,  Endo/Other  Hypothyroidism  Obesity  Renal/GU negative Renal ROS   Hx/o Prostate Ca S/P robotic prostatectomy    Musculoskeletal  (+) Arthritis , Osteoarthritis,  OA right hip   Abdominal  (+) + obese  Peds  Hematology negative hematology ROS (+)   Anesthesia Other Findings   Reproductive/Obstetrics ED                             Anesthesia Physical Anesthesia Plan  ASA: 2  Anesthesia Plan: Spinal   Post-op Pain Management: Regional block*   Induction: Intravenous  PONV Risk Score and Plan: 2 and Treatment may vary due to age or medical condition, Propofol infusion and Midazolam  Airway Management Planned: Natural Airway and Simple Face Mask  Additional Equipment: None  Intra-op Plan:   Post-operative Plan:   Informed Consent: I have reviewed the patients History and Physical, chart, labs and discussed the procedure including the risks, benefits and alternatives for the proposed anesthesia with the patient or authorized representative who has indicated his/her understanding and acceptance.     Dental advisory given  Plan Discussed with: CRNA and  Anesthesiologist  Anesthesia Plan Comments:         Anesthesia Quick Evaluation

## 2023-01-04 NOTE — Anesthesia Procedure Notes (Signed)
Spinal  Start time: 01/04/2023 11:17 AM End time: 01/04/2023 11:22 AM Staffing Performed: resident/CRNA  Resident/CRNA: Kizzie Fantasia, CRNA Performed by: Kizzie Fantasia, CRNA Authorized by: Mal Amabile, MD   Preanesthetic Checklist Completed: patient identified, IV checked, site marked, risks and benefits discussed, surgical consent, monitors and equipment checked, pre-op evaluation and timeout performed Spinal Block Patient position: sitting Prep: DuraPrep Patient monitoring: heart rate Approach: midline Location: L3-4 Injection technique: single-shot Needle Needle type: Pencan  Needle gauge: 24 G Needle length: 9 cm Needle insertion depth: 8 cm Assessment Events: CSF return

## 2023-01-05 DIAGNOSIS — I10 Essential (primary) hypertension: Secondary | ICD-10-CM | POA: Diagnosis not present

## 2023-01-05 DIAGNOSIS — E039 Hypothyroidism, unspecified: Secondary | ICD-10-CM | POA: Diagnosis not present

## 2023-01-05 DIAGNOSIS — Z79899 Other long term (current) drug therapy: Secondary | ICD-10-CM | POA: Diagnosis not present

## 2023-01-05 DIAGNOSIS — Z7982 Long term (current) use of aspirin: Secondary | ICD-10-CM | POA: Diagnosis not present

## 2023-01-05 DIAGNOSIS — M1611 Unilateral primary osteoarthritis, right hip: Secondary | ICD-10-CM | POA: Diagnosis not present

## 2023-01-05 DIAGNOSIS — Z8546 Personal history of malignant neoplasm of prostate: Secondary | ICD-10-CM | POA: Diagnosis not present

## 2023-01-05 LAB — CBC
HCT: 39 % (ref 39.0–52.0)
Hemoglobin: 13.7 g/dL (ref 13.0–17.0)
MCH: 34.7 pg — ABNORMAL HIGH (ref 26.0–34.0)
MCHC: 35.1 g/dL (ref 30.0–36.0)
MCV: 98.7 fL (ref 80.0–100.0)
Platelets: 205 10*3/uL (ref 150–400)
RBC: 3.95 MIL/uL — ABNORMAL LOW (ref 4.22–5.81)
RDW: 11 % — ABNORMAL LOW (ref 11.5–15.5)
WBC: 12.8 10*3/uL — ABNORMAL HIGH (ref 4.0–10.5)
nRBC: 0 % (ref 0.0–0.2)

## 2023-01-05 LAB — BASIC METABOLIC PANEL
Anion gap: 8 (ref 5–15)
BUN: 13 mg/dL (ref 6–20)
CO2: 22 mmol/L (ref 22–32)
Calcium: 8.6 mg/dL — ABNORMAL LOW (ref 8.9–10.3)
Chloride: 101 mmol/L (ref 98–111)
Creatinine, Ser: 0.88 mg/dL (ref 0.61–1.24)
GFR, Estimated: >60 mL/min (ref >60)
Glucose, Bld: 133 mg/dL — ABNORMAL HIGH (ref 70–99)
Potassium: 4.1 mmol/L (ref 3.5–5.1)
Sodium: 131 mmol/L — ABNORMAL LOW (ref 135–145)

## 2023-01-05 MED ORDER — METHOCARBAMOL 500 MG PO TABS: 500.0000 mg | ORAL_TABLET | Freq: Four times a day (QID) | ORAL | 1 refills | Status: AC | PRN

## 2023-01-05 MED ORDER — OXYCODONE HCL 5 MG PO TABS: 5.0000 mg | ORAL_TABLET | Freq: Four times a day (QID) | ORAL | 0 refills | Status: DC | PRN

## 2023-01-05 MED ORDER — ASPIRIN 81 MG PO CHEW: 81.0000 mg | CHEWABLE_TABLET | Freq: Two times a day (BID) | ORAL | 0 refills | Status: AC

## 2023-01-05 NOTE — Progress Notes (Signed)
Subjective: 1 Day Post-Op Procedure(s) (LRB): RIGHT TOTAL HIP ARTHROPLASTY ANTERIOR APPROACH (Right) Patient reports pain as moderate.    Objective: Vital signs in last 24 hours: Temp:  [97.6 F (36.4 C)-98.8 F (37.1 C)] 98.8 F (37.1 C) (04/06 0906) Pulse Rate:  [54-79] 67 (04/06 0906) Resp:  [10-19] 16 (04/06 0906) BP: (113-155)/(73-97) 122/82 (04/06 0906) SpO2:  [91 %-97 %] 96 % (04/06 0906)  Intake/Output from previous day: 04/05 0701 - 04/06 0700 In: 1554.6 [P.O.:860; I.V.:600; IV Piggyback:94.6] Out: 3200 [Urine:2950; Blood:250] Intake/Output this shift: Total I/O In: 120 [P.O.:120] Out: 0   Recent Labs    01/04/23 1353 01/05/23 0346  HGB 15.1 13.7   Recent Labs    01/04/23 1353 01/05/23 0346  WBC 9.9 12.8*  RBC 4.43 3.95*  HCT 45.2 39.0  PLT 216 205   Recent Labs    01/04/23 1353 01/05/23 0346  NA 134* 131*  K 4.1 4.1  CL 101 101  CO2 23 22  BUN 10 13  CREATININE 0.96 0.88  GLUCOSE 120* 133*  CALCIUM 8.7* 8.6*   No results for input(s): "LABPT", "INR" in the last 72 hours.  Sensation intact distally Intact pulses distally Dorsiflexion/Plantar flexion intact Incision: scant drainage   Assessment/Plan: 1 Day Post-Op Procedure(s) (LRB): RIGHT TOTAL HIP ARTHROPLASTY ANTERIOR APPROACH (Right) Up with therapy Discharge home with home health      Kathryne Hitch 01/05/2023, 10:05 AM

## 2023-01-05 NOTE — Discharge Instructions (Signed)

## 2023-01-05 NOTE — Progress Notes (Signed)
Physical Therapy Treatment Patient Details Name: Adam Crane MRN: [id number] DOB: 01-28-1966 Today's Date: 01/05/2023   History of Present Illness 57 yo male presents to therapy s/p R THA, anterior approach on 01/04/2023 due to failure of conservative measures. Pt PMH includes but is not limited to: HDL, HTN, prostate ca, and hypothyroidism.    PT Comments    Pain controlled per pt report. Reviewed/practiced exercises, gait training, and stair training. Encouraged pt to ambulate often at home, as tolerated. All PT education completed.    Recommendations for follow up therapy are one component of a multi-disciplinary discharge planning process, led by the attending physician.  Recommendations may be updated based on patient status, additional functional criteria and insurance authorization.  Follow Up Recommendations       Assistance Recommended at Discharge Intermittent Supervision/Assistance  Patient can return home with the following Help with stairs or ramp for entrance;Assist for transportation;Assistance with cooking/housework   Equipment Recommendations       Recommendations for Other Services       Precautions / Restrictions Restrictions Weight Bearing Restrictions: No Other Position/Activity Restrictions: WBAT     Mobility  Bed Mobility Overal bed mobility: Needs Assistance Bed Mobility: Supine to Sit     Supine to sit: Supervision, HOB elevated     General bed mobility comments: Increased time.    Transfers Overall transfer level: Needs assistance Equipment used: Rolling walker (2 wheels) Transfers: Sit to/from Stand Sit to Stand: Supervision           General transfer comment: Cues for safety, hand placement.    Ambulation/Gait Ambulation/Gait assistance: Min guard Gait Distance (Feet): 125 Feet Assistive device: Rolling walker (2 wheels) Gait Pattern/deviations: Step-through pattern, Decreased stride length, Decreased step length - right        General Gait Details: Min guard A for safety. Fair bit of reliance on UEs on RW. Tolerated distance well.   Stairs Stairs: Yes Stairs assistance: Min assist Stair Management: Step to pattern, Forwards, With walker Number of Stairs: 2 General stair comments: up and over portable stairs x 1. cues for safety, technique, sequence. Min A only to stabilize walker-instructed pt to have wife stabilize walker as well.   Wheelchair Mobility    Modified Rankin (Stroke Patients Only)       Balance Overall balance assessment: Needs assistance         Standing balance support: Reliant on assistive device for balance, During functional activity Standing balance-Leahy Scale: Fair                              Cognition Arousal/Alertness: Awake/alert Behavior During Therapy: WFL for tasks assessed/performed Overall Cognitive Status: Within Functional Limits for tasks assessed                                          Exercises Total Joint Exercises Ankle Circles/Pumps: AROM, Both, 15 reps Quad Sets: AROM, Both, 15 reps Heel Slides: AROM, Right, 10 reps Hip ABduction/ADduction: AROM, Right, 10 reps    General Comments        Pertinent Vitals/Pain Pain Assessment Pain Assessment: 0-10 Pain Score: 4  Pain Location: R hip/thigh Pain Descriptors / Indicators: Discomfort, Sore Pain Intervention(s): Monitored during session, Repositioned, Ice applied    Home Living  Prior Function            PT Goals (current goals can now be found in the care plan section) Progress towards PT goals: Progressing toward goals    Frequency    7X/week      PT Plan Current plan remains appropriate    Co-evaluation              AM-PAC PT "6 Clicks" Mobility   Outcome Measure  Help needed turning from your back to your side while in a flat bed without using bedrails?: None Help needed moving from lying on your  back to sitting on the side of a flat bed without using bedrails?: None Help needed moving to and from a bed to a chair (including a wheelchair)?: None Help needed standing up from a chair using your arms (e.g., wheelchair or bedside chair)?: A Little Help needed to walk in hospital room?: A Little Help needed climbing 3-5 steps with a railing? : A Little 6 Click Score: 21    End of Session Equipment Utilized During Treatment: Gait belt Activity Tolerance: Patient tolerated treatment well Patient left: in chair;with call bell/phone within reach   PT Visit Diagnosis: Other abnormalities of gait and mobility (R26.89);Pain Pain - Right/Left: Right Pain - part of body: Hip     Time: 1016-1030 PT Time Calculation (min) (ACUTE ONLY): 14 min  Charges:  $Gait Training: 8-22 mins                         Faye Ramsay, PT Acute Rehabilitation  Office: [phone]

## 2023-01-05 NOTE — Plan of Care (Signed)
  Problem: Education: Goal: Individualized Educational Video(s) Outcome: Progressing   Problem: Activity: Goal: Ability to avoid complications of mobility impairment will improve Outcome: Progressing   Problem: Activity: Goal: Ability to tolerate increased activity will improve Outcome: Progressing   

## 2023-01-05 NOTE — Discharge Summary (Signed)
Patient ID: Adam Crane MRN: [id number] DOB/AGE: 59/03/1966 59 y.o.  Admit date: 01/04/2023 Discharge date: 01/05/2023  Admission Diagnoses:  Principal Problem:   Unilateral primary osteoarthritis, right hip Active Problems:   Status post total replacement of right hip   Discharge Diagnoses:  Same  Past Medical History:  Diagnosis Date   Hyperlipidemia    pt denies   Hypertension    on meds   Hypothyroidism    on meds   Prostate cancer 11/04/2013   gleason 3+3=6, volume 35 cc   Thyroid disease    hypothyroid    Surgeries: Procedure(s): RIGHT TOTAL HIP ARTHROPLASTY ANTERIOR APPROACH on 01/04/2023   Consultants:   Discharged Condition: Improved  Hospital Course: Adam Crane is an 57 y.o. male who was admitted 01/04/2023 for operative treatment ofUnilateral primary osteoarthritis, right hip. Patient has severe unremitting pain that affects sleep, daily activities, and work/hobbies. After pre-op clearance the patient was taken to the operating room on 01/04/2023 and underwent  Procedure(s): RIGHT TOTAL HIP ARTHROPLASTY ANTERIOR APPROACH.    Patient was given perioperative antibiotics:  Anti-infectives (From admission, onward)    Start     Dose/Rate Route Frequency Ordered Stop   01/04/23 1730  ceFAZolin (ANCEF) IVPB 1 g/50 mL premix        1 g 100 mL/hr over 30 Minutes Intravenous Every 6 hours 01/04/23 1452 01/05/23 0002   01/04/23 0915  ceFAZolin (ANCEF) IVPB 3g/100 mL premix        3 g 200 mL/hr over 30 Minutes Intravenous On call to O.R. 01/04/23 0904 01/04/23 1125        Patient was given sequential compression devices, early ambulation, and chemoprophylaxis to prevent DVT.  Patient benefited maximally from hospital stay and there were no complications.    Recent vital signs: Patient Vitals for the past 24 hrs:  BP Temp Temp src Pulse Resp SpO2  01/05/23 0906 122/82 98.8 F (37.1 C) -- 67 16 96 %  01/05/23 0501 (!) 141/82 98.2 F (36.8 C) Oral 74 18  95 %  01/05/23 0148 (!) 155/86 98.1 F (36.7 C) -- 72 18 91 %  01/04/23 2108 (!) 142/77 98.4 F (36.9 C) -- 73 18 94 %  01/04/23 1654 (!) 142/74 98.1 F (36.7 C) -- 79 16 97 %  01/04/23 1449 (!) 150/90 97.9 F (36.6 C) Oral 62 16 97 %  01/04/23 1430 (!) 141/93 -- -- (!) 58 15 92 %  01/04/23 1415 (!) 143/97 97.6 F (36.4 C) -- 60 19 95 %  01/04/23 1400 (!) 144/88 -- -- (!) 56 13 95 %  01/04/23 1345 (!) 141/85 -- -- (!) 54 10 94 %  01/04/23 1330 130/73 -- -- (!) 55 10 93 %  01/04/23 1315 123/76 -- -- (!) 59 16 93 %  01/04/23 1303 113/73 97.6 F (36.4 C) -- 61 15 92 %     Recent laboratory studies:  Recent Labs    01/04/23 1353 01/05/23 0346  WBC 9.9 12.8*  HGB 15.1 13.7  HCT 45.2 39.0  PLT 216 205  NA 134* 131*  K 4.1 4.1  CL 101 101  CO2 23 22  BUN 10 13  CREATININE 0.96 0.88  GLUCOSE 120* 133*  CALCIUM 8.7* 8.6*     Discharge Medications:   Allergies as of 01/05/2023   No Known Allergies      Medication List     STOP taking these medications    aspirin EC 81 MG tablet Replaced  by: aspirin 81 MG chewable tablet       TAKE these medications    amLODipine 10 MG tablet Commonly known as: NORVASC Take 10 mg by mouth daily.   aspirin 81 MG chewable tablet Chew 1 tablet (81 mg total) by mouth 2 (two) times daily. Replaces: aspirin EC 81 MG tablet   ibuprofen 200 MG tablet Commonly known as: ADVIL Take 400-800 mg by mouth daily as needed for moderate pain.   levothyroxine 200 MCG tablet Commonly known as: SYNTHROID Take 200 mcg by mouth daily before breakfast. Takes with the to = every morning.   levothyroxine 25 MCG tablet Commonly known as: SYNTHROID Take 25 mcg by mouth daily before breakfast. Takes with the 200 mcg to = every morning.   losartan 100 MG tablet Commonly known as: COZAAR Take 100 mg by mouth daily.   methocarbamol 500 MG tablet Commonly known as: ROBAXIN Take 1 tablet (500 mg total) by mouth every 6 (six)  hours as needed for muscle spasms.   Metoprolol Succinate 100 MG Cs24 Take 100 mg by mouth daily.   multivitamin capsule Take 1 capsule by mouth daily.   oxyCODONE 5 MG immediate release tablet Commonly known as: Oxy IR/ROXICODONE Take 1-2 tablets (5-10 mg total) by mouth every 6 (six) hours as needed for moderate pain (pain score 4-6).   Viagra 50 MG tablet Generic drug: sildenafil Take 50 mg by mouth as needed for erectile dysfunction.   vitamin E 1000 UNIT capsule Take 1 capsule by mouth daily at 6 (six) AM.               Durable Medical Equipment  (From admission, onward)           Start     Ordered   01/04/23 1453  DME 3 n 1  Once        01/04/23 1452   01/04/23 1453  DME Walker rolling  Once       Question Answer Comment  Walker: With 5 Inch Wheels   Patient needs a walker to treat with the following condition Status post total replacement of right hip      01/04/23 1452            Diagnostic Studies: DG Pelvis Portable  Result Date: 01/04/2023 CLINICAL DATA:  Postoperative day 0 for right total hip arthroplasty EXAM: PORTABLE PELVIS 1-2 VIEWS COMPARISON:  Intraoperative and preoperative images of the right hip from 01/04/2023 and 10/24/2022, respectively FINDINGS: Right total hip prosthesis in place with typical findings of gas in the adjacent soft tissues and skin clips. No periprosthetic fracture or early complicating feature is apparent. Moderate degenerative arthropathy left hip with articular space narrowing and marginal spurring. IMPRESSION: 1. Right total hip prosthesis without complicating feature identified. 2. Moderate degenerative arthropathy of the left hip. Electronically Signed   By: Gaylyn Rong M.D.   On: 01/04/2023 13:23   DG HIP UNILAT WITH PELVIS 1V RIGHT  Result Date: 01/04/2023 CLINICAL DATA:  [id number] Surgery, elective [id number] EXAM: DG HIP (WITH OR WITHOUT PELVIS) 1V RIGHT COMPARISON:  Radiograph 10/24/2022 FINDINGS: Intraoperative  images during right hip arthroplasty. Alignment is normal. Hardware is intact. No evidence of fracture. IMPRESSION: Intraoperative images during right hip arthroplasty. Alignment is normal. No evidence of immediate hardware complication. Electronically Signed   By: Caprice Renshaw M.D.   On: 01/04/2023 12:59   DG C-Arm 1-60 Min-No Report  Result Date: 01/04/2023 Fluoroscopy was utilized by the requesting physician.  No radiographic interpretation.   DG C-Arm 1-60 Min-No Report  Result Date: 01/04/2023 Fluoroscopy was utilized by the requesting physician.  No radiographic interpretation.    Disposition: Discharge disposition: 01-Home or Self Care          Follow-up Information     [facility] Follow up.   Specialty: Home Health Services Why: to provide home physical therapy visits Contact information: [address] [place] [address] [phone]         Kathryne Hitch, MD Follow up in 2 week(s).   Specialty: Orthopedic Surgery Contact information: [address] [place] [address] [phone]                  Signed: Kathryne Hitch 01/05/2023, 10:08 AM

## 2023-01-06 DIAGNOSIS — Z471 Aftercare following joint replacement surgery: Secondary | ICD-10-CM | POA: Diagnosis not present

## 2023-01-07 ENCOUNTER — Encounter (HOSPITAL_COMMUNITY): Payer: Self-pay | Admitting: Orthopaedic Surgery

## 2023-01-09 DIAGNOSIS — E039 Hypothyroidism, unspecified: Secondary | ICD-10-CM | POA: Diagnosis not present

## 2023-01-09 DIAGNOSIS — Z471 Aftercare following joint replacement surgery: Secondary | ICD-10-CM | POA: Diagnosis not present

## 2023-01-09 DIAGNOSIS — Z8546 Personal history of malignant neoplasm of prostate: Secondary | ICD-10-CM | POA: Diagnosis not present

## 2023-01-09 DIAGNOSIS — I10 Essential (primary) hypertension: Secondary | ICD-10-CM | POA: Diagnosis not present

## 2023-01-09 DIAGNOSIS — Z96641 Presence of right artificial hip joint: Secondary | ICD-10-CM | POA: Diagnosis not present

## 2023-01-09 DIAGNOSIS — Z7982 Long term (current) use of aspirin: Secondary | ICD-10-CM | POA: Diagnosis not present

## 2023-01-09 DIAGNOSIS — E785 Hyperlipidemia, unspecified: Secondary | ICD-10-CM | POA: Diagnosis not present

## 2023-01-11 DIAGNOSIS — Z96641 Presence of right artificial hip joint: Secondary | ICD-10-CM | POA: Diagnosis not present

## 2023-01-11 DIAGNOSIS — Z8546 Personal history of malignant neoplasm of prostate: Secondary | ICD-10-CM | POA: Diagnosis not present

## 2023-01-11 DIAGNOSIS — Z471 Aftercare following joint replacement surgery: Secondary | ICD-10-CM | POA: Diagnosis not present

## 2023-01-11 DIAGNOSIS — E785 Hyperlipidemia, unspecified: Secondary | ICD-10-CM | POA: Diagnosis not present

## 2023-01-11 DIAGNOSIS — Z7982 Long term (current) use of aspirin: Secondary | ICD-10-CM | POA: Diagnosis not present

## 2023-01-11 DIAGNOSIS — E039 Hypothyroidism, unspecified: Secondary | ICD-10-CM | POA: Diagnosis not present

## 2023-01-11 DIAGNOSIS — I10 Essential (primary) hypertension: Secondary | ICD-10-CM | POA: Diagnosis not present

## 2023-01-15 DIAGNOSIS — I10 Essential (primary) hypertension: Secondary | ICD-10-CM | POA: Diagnosis not present

## 2023-01-15 DIAGNOSIS — E785 Hyperlipidemia, unspecified: Secondary | ICD-10-CM | POA: Diagnosis not present

## 2023-01-15 DIAGNOSIS — Z96641 Presence of right artificial hip joint: Secondary | ICD-10-CM | POA: Diagnosis not present

## 2023-01-15 DIAGNOSIS — Z7982 Long term (current) use of aspirin: Secondary | ICD-10-CM | POA: Diagnosis not present

## 2023-01-15 DIAGNOSIS — Z471 Aftercare following joint replacement surgery: Secondary | ICD-10-CM | POA: Diagnosis not present

## 2023-01-15 DIAGNOSIS — E039 Hypothyroidism, unspecified: Secondary | ICD-10-CM | POA: Diagnosis not present

## 2023-01-15 DIAGNOSIS — Z8546 Personal history of malignant neoplasm of prostate: Secondary | ICD-10-CM | POA: Diagnosis not present

## 2023-01-17 ENCOUNTER — Encounter: Payer: Self-pay | Admitting: Orthopaedic Surgery

## 2023-01-17 ENCOUNTER — Ambulatory Visit (INDEPENDENT_AMBULATORY_CARE_PROVIDER_SITE_OTHER): Payer: BC Managed Care – PPO | Admitting: Orthopaedic Surgery

## 2023-01-17 DIAGNOSIS — E785 Hyperlipidemia, unspecified: Secondary | ICD-10-CM | POA: Diagnosis not present

## 2023-01-17 DIAGNOSIS — Z96641 Presence of right artificial hip joint: Secondary | ICD-10-CM

## 2023-01-17 DIAGNOSIS — Z471 Aftercare following joint replacement surgery: Secondary | ICD-10-CM | POA: Diagnosis not present

## 2023-01-17 DIAGNOSIS — E039 Hypothyroidism, unspecified: Secondary | ICD-10-CM | POA: Diagnosis not present

## 2023-01-17 DIAGNOSIS — Z7982 Long term (current) use of aspirin: Secondary | ICD-10-CM | POA: Diagnosis not present

## 2023-01-17 DIAGNOSIS — I10 Essential (primary) hypertension: Secondary | ICD-10-CM | POA: Diagnosis not present

## 2023-01-17 DIAGNOSIS — Z8546 Personal history of malignant neoplasm of prostate: Secondary | ICD-10-CM | POA: Diagnosis not present

## 2023-01-17 NOTE — Progress Notes (Signed)
The patient comes in today for his 2-week visit status post a right hip replacement.  He is doing great.  He is already walking without assistive device.  He has been compliant with a baby aspirin twice daily.  He is only taking Robaxin but he says it did not really help and he is not taking any pain medicine.  His right hip incision looks good.  The staples been removed and Steri-Strips applied.  There is a hematoma but no seroma.  He can stop his baby aspirin twice daily.  He will continue to increase his activities as comfort allows.  I would like to see him back in 4 weeks for repeat visit but no x-rays are needed.

## 2023-02-06 DIAGNOSIS — E782 Mixed hyperlipidemia: Secondary | ICD-10-CM | POA: Diagnosis not present

## 2023-02-06 DIAGNOSIS — Z6835 Body mass index (BMI) 35.0-35.9, adult: Secondary | ICD-10-CM | POA: Diagnosis not present

## 2023-02-06 DIAGNOSIS — Z8546 Personal history of malignant neoplasm of prostate: Secondary | ICD-10-CM | POA: Diagnosis not present

## 2023-02-06 DIAGNOSIS — I10 Essential (primary) hypertension: Secondary | ICD-10-CM | POA: Diagnosis not present

## 2023-02-06 DIAGNOSIS — Z0000 Encounter for general adult medical examination without abnormal findings: Secondary | ICD-10-CM | POA: Diagnosis not present

## 2023-02-06 DIAGNOSIS — Z79899 Other long term (current) drug therapy: Secondary | ICD-10-CM | POA: Diagnosis not present

## 2023-02-06 DIAGNOSIS — E039 Hypothyroidism, unspecified: Secondary | ICD-10-CM | POA: Diagnosis not present

## 2023-02-18 ENCOUNTER — Encounter: Payer: Self-pay | Admitting: Orthopaedic Surgery

## 2023-02-18 ENCOUNTER — Ambulatory Visit (INDEPENDENT_AMBULATORY_CARE_PROVIDER_SITE_OTHER): Payer: BC Managed Care – PPO | Admitting: Orthopaedic Surgery

## 2023-02-18 DIAGNOSIS — Z96641 Presence of right artificial hip joint: Secondary | ICD-10-CM

## 2023-02-18 NOTE — Progress Notes (Signed)
The patient is a 57 year old who is now 6 weeks status post a right total hip arthroplasty.  He is active.  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  He is doing well and not walking with any significant limp or an assistive device.  His right hip is just slightly stiff on rotation but overall looks good.  His mobility looks good as does his leg lengths.  I will see him back in 6 months unless there are issues.  At that visit we will have a standing low AP pelvis and lateral of his right operative hip.  All questions and concerns were addressed and answered.

## 2023-08-21 ENCOUNTER — Other Ambulatory Visit (INDEPENDENT_AMBULATORY_CARE_PROVIDER_SITE_OTHER): Payer: BC Managed Care – PPO

## 2023-08-21 ENCOUNTER — Ambulatory Visit (INDEPENDENT_AMBULATORY_CARE_PROVIDER_SITE_OTHER): Payer: BC Managed Care – PPO | Admitting: Orthopaedic Surgery

## 2023-08-21 ENCOUNTER — Encounter: Payer: Self-pay | Admitting: Orthopaedic Surgery

## 2023-08-21 DIAGNOSIS — I10 Essential (primary) hypertension: Secondary | ICD-10-CM | POA: Diagnosis not present

## 2023-08-21 DIAGNOSIS — E782 Mixed hyperlipidemia: Secondary | ICD-10-CM | POA: Diagnosis not present

## 2023-08-21 DIAGNOSIS — E039 Hypothyroidism, unspecified: Secondary | ICD-10-CM | POA: Diagnosis not present

## 2023-08-21 DIAGNOSIS — Z96641 Presence of right artificial hip joint: Secondary | ICD-10-CM | POA: Diagnosis not present

## 2023-08-21 DIAGNOSIS — Z8546 Personal history of malignant neoplasm of prostate: Secondary | ICD-10-CM | POA: Diagnosis not present

## 2023-08-21 NOTE — Progress Notes (Signed)
HPI: [name] returns today for follow-up status post right total hip arthroplasty 01/04/2023.  States he has no pain at all.  No concerns.  He is back to normal activities.  He states that his incisions healed well.  He does ask about his left hip but is having no pain in the hip whatsoever at this point in time.  Review of systems: See HPI otherwise negative  Physical exam: General Well-developed well-nourished male who ambulates without. Right hip good range of motion without pain.  Is able to cross at length.  Dorsiflexion plantarflexion right ankle intact.   Radiographs: AP pelvis lateral view right hip: No acute fractures.  Both hips well located.  Status post right total hip arthroplasty with well seated components.  Left hip slight arthritic changes with an inferior osteophyte off the femoral head slight cam impingement type anatomy.  Mild narrowing joint space.  Impression: Status post right total hip arthroplasty   Plan: He will follow-up with [place] in 6 months for follow-up of the right total hip.  At that time we will obtain an AP pelvis lateral view of the right hip.  Questions were encouraged and answered by Dr. Magnus Ivan myself.

## 2024-02-17 ENCOUNTER — Encounter: Payer: Self-pay | Admitting: Orthopaedic Surgery

## 2024-02-17 ENCOUNTER — Other Ambulatory Visit (INDEPENDENT_AMBULATORY_CARE_PROVIDER_SITE_OTHER): Payer: Self-pay

## 2024-02-17 ENCOUNTER — Ambulatory Visit: Payer: BC Managed Care – PPO | Admitting: Orthopaedic Surgery

## 2024-02-17 DIAGNOSIS — Z96641 Presence of right artificial hip joint: Secondary | ICD-10-CM | POA: Diagnosis not present

## 2024-02-17 NOTE — Progress Notes (Signed)
 The patient is a very active 58 year old gentleman who is now just over a year out from a right total hip arthroplasty to treat significant right hip arthritis.  He says he is doing great and has no issues with the right hip.  He denies any significant issues with his left hip.  He walks with a normal-appearing gait.  His leg lengths appear equal.  Both hips have pretty good range of motion with only slight stiffness.  They are pain-free.  An AP pelvis along the right hip shows a well-seated right total hip arthroplasty with no complicating features.  It is bone ingrown.  The left hip shows slight superior joint space narrowing with slight evidence of femoral acetabular impingement.  So far he is not having any type of symptoms with his left hip and if he does develop any issues he knows to reach out and let us  know.  All questions and concerns were addressed and answered.  Follow-up is as needed.

## 2024-08-03 ENCOUNTER — Encounter: Payer: Self-pay | Admitting: Radiology

## 2024-08-19 DIAGNOSIS — M79644 Pain in right finger(s): Secondary | ICD-10-CM | POA: Diagnosis not present

## 2024-08-26 DIAGNOSIS — E039 Hypothyroidism, unspecified: Secondary | ICD-10-CM | POA: Diagnosis not present

## 2024-08-26 DIAGNOSIS — Z8546 Personal history of malignant neoplasm of prostate: Secondary | ICD-10-CM | POA: Diagnosis not present

## 2024-08-26 DIAGNOSIS — E782 Mixed hyperlipidemia: Secondary | ICD-10-CM | POA: Diagnosis not present

## 2024-08-26 DIAGNOSIS — Z0000 Encounter for general adult medical examination without abnormal findings: Secondary | ICD-10-CM | POA: Diagnosis not present

## 2024-08-26 DIAGNOSIS — I10 Essential (primary) hypertension: Secondary | ICD-10-CM | POA: Diagnosis not present
